# Patient Record
Sex: Female | Born: 1990 | Race: White | Hispanic: No | Marital: Married | Smoking: Never smoker
Health system: Southern US, Community
[De-identification: ages and names within clinical notes are randomized; demographics above are authoritative.]

## PROBLEM LIST (undated history)

## (undated) ENCOUNTER — Inpatient Hospital Stay (HOSPITAL_COMMUNITY): Payer: Self-pay

## (undated) ENCOUNTER — Inpatient Hospital Stay: Admission: EM | Payer: Self-pay | Source: Home / Self Care

## (undated) DIAGNOSIS — Z1501 Genetic susceptibility to malignant neoplasm of breast: Secondary | ICD-10-CM

## (undated) DIAGNOSIS — Z15060 Genetic susceptibility to colorectal cancer: Secondary | ICD-10-CM

---

## 2017-12-25 ENCOUNTER — Other Ambulatory Visit: Payer: Self-pay | Admitting: Obstetrics & Gynecology

## 2017-12-25 DIAGNOSIS — R9230 Dense breasts, unspecified: Secondary | ICD-10-CM

## 2017-12-27 ENCOUNTER — Other Ambulatory Visit: Payer: Self-pay | Admitting: Obstetrics & Gynecology

## 2017-12-27 DIAGNOSIS — R9230 Dense breasts, unspecified: Secondary | ICD-10-CM

## 2018-02-20 ENCOUNTER — Other Ambulatory Visit: Payer: Self-pay

## 2019-05-20 ENCOUNTER — Other Ambulatory Visit: Payer: Self-pay | Admitting: Obstetrics & Gynecology

## 2019-05-20 DIAGNOSIS — Z1509 Genetic susceptibility to other malignant neoplasm: Secondary | ICD-10-CM

## 2019-05-21 ENCOUNTER — Other Ambulatory Visit: Payer: Self-pay | Admitting: Obstetrics & Gynecology

## 2019-05-21 DIAGNOSIS — Z1231 Encounter for screening mammogram for malignant neoplasm of breast: Secondary | ICD-10-CM

## 2019-07-01 ENCOUNTER — Other Ambulatory Visit: Payer: Self-pay

## 2019-07-01 ENCOUNTER — Ambulatory Visit: Payer: 59 | Attending: Internal Medicine

## 2019-07-01 DIAGNOSIS — Z20822 Contact with and (suspected) exposure to covid-19: Secondary | ICD-10-CM

## 2019-07-03 LAB — NOVEL CORONAVIRUS, NAA: SARS-CoV-2, NAA: NOT DETECTED

## 2019-07-14 ENCOUNTER — Ambulatory Visit
Admission: RE | Admit: 2019-07-14 | Discharge: 2019-07-14 | Disposition: A | Discharge status: Home / Self Care | Payer: 59 | Source: Ambulatory Visit | Attending: Obstetrics & Gynecology | Admitting: Obstetrics & Gynecology

## 2019-07-14 ENCOUNTER — Other Ambulatory Visit: Payer: Self-pay

## 2019-07-14 DIAGNOSIS — Z1231 Encounter for screening mammogram for malignant neoplasm of breast: Secondary | ICD-10-CM

## 2020-08-09 ENCOUNTER — Other Ambulatory Visit: Payer: Self-pay | Admitting: Obstetrics and Gynecology

## 2020-08-09 DIAGNOSIS — Z1231 Encounter for screening mammogram for malignant neoplasm of breast: Secondary | ICD-10-CM

## 2020-08-12 LAB — OB RESULTS CONSOLE HIV ANTIBODY (ROUTINE TESTING): HIV: NONREACTIVE

## 2020-09-28 ENCOUNTER — Ambulatory Visit: Payer: 59

## 2020-11-17 ENCOUNTER — Other Ambulatory Visit: Payer: Self-pay

## 2020-11-17 ENCOUNTER — Ambulatory Visit
Admission: RE | Admit: 2020-11-17 | Discharge: 2020-11-17 | Disposition: A | Discharge status: Home / Self Care | Payer: 59 | Source: Ambulatory Visit | Attending: Obstetrics and Gynecology | Admitting: Obstetrics and Gynecology

## 2020-11-17 DIAGNOSIS — Z1231 Encounter for screening mammogram for malignant neoplasm of breast: Secondary | ICD-10-CM

## 2021-02-07 LAB — OB RESULTS CONSOLE HEPATITIS B SURFACE ANTIGEN: Hepatitis B Surface Ag: NEGATIVE

## 2021-02-07 LAB — HEPATITIS C ANTIBODY: HCV Ab: NEGATIVE

## 2021-02-07 LAB — OB RESULTS CONSOLE HIV ANTIBODY (ROUTINE TESTING): HIV: NONREACTIVE

## 2021-02-07 LAB — OB RESULTS CONSOLE RUBELLA ANTIBODY, IGM: Rubella: IMMUNE

## 2021-09-16 ENCOUNTER — Inpatient Hospital Stay (HOSPITAL_COMMUNITY)
Admission: AD | Admit: 2021-09-16 | Discharge: 2021-09-19 | DRG: 787 | Disposition: A | Discharge status: Home / Self Care | Payer: 59 | Attending: Obstetrics and Gynecology | Admitting: Obstetrics and Gynecology

## 2021-09-16 ENCOUNTER — Encounter (HOSPITAL_COMMUNITY): Payer: Self-pay | Admitting: Obstetrics and Gynecology

## 2021-09-16 ENCOUNTER — Other Ambulatory Visit: Payer: Self-pay

## 2021-09-16 DIAGNOSIS — Z3A37 37 weeks gestation of pregnancy: Secondary | ICD-10-CM

## 2021-09-16 DIAGNOSIS — B001 Herpesviral vesicular dermatitis: Secondary | ICD-10-CM | POA: Diagnosis present

## 2021-09-16 DIAGNOSIS — O479 False labor, unspecified: Secondary | ICD-10-CM

## 2021-09-16 DIAGNOSIS — Z6791 Unspecified blood type, Rh negative: Secondary | ICD-10-CM

## 2021-09-16 DIAGNOSIS — O321XX0 Maternal care for breech presentation, not applicable or unspecified: Principal | ICD-10-CM | POA: Diagnosis present

## 2021-09-16 DIAGNOSIS — O26893 Other specified pregnancy related conditions, third trimester: Secondary | ICD-10-CM | POA: Diagnosis present

## 2021-09-16 DIAGNOSIS — O9852 Other viral diseases complicating childbirth: Secondary | ICD-10-CM | POA: Diagnosis present

## 2021-09-16 NOTE — MAU Note (Signed)
Rachael Neal is a 31 y.o. at Unknown here in MAU reporting: Ctx 5-6 min apart for the last 3 hours. No LOF or bleeding.  ?LMP:  ?Onset of complaint: 09/16/21 ?Pain score:  ?There were no vitals filed for this visit.   ?FHT: ?Lab orders placed from triage:  ? ?

## 2021-09-17 ENCOUNTER — Inpatient Hospital Stay (HOSPITAL_COMMUNITY): Payer: 59 | Admitting: Certified Registered"

## 2021-09-17 ENCOUNTER — Inpatient Hospital Stay (HOSPITAL_BASED_OUTPATIENT_CLINIC_OR_DEPARTMENT_OTHER): Payer: 59

## 2021-09-17 ENCOUNTER — Encounter (HOSPITAL_COMMUNITY)
Admission: AD | Disposition: A | Discharge status: Home / Self Care | Payer: Self-pay | Source: Home / Self Care | Attending: Obstetrics and Gynecology

## 2021-09-17 DIAGNOSIS — Z3A37 37 weeks gestation of pregnancy: Secondary | ICD-10-CM

## 2021-09-17 DIAGNOSIS — O9852 Other viral diseases complicating childbirth: Secondary | ICD-10-CM | POA: Diagnosis present

## 2021-09-17 DIAGNOSIS — O321XX0 Maternal care for breech presentation, not applicable or unspecified: Secondary | ICD-10-CM

## 2021-09-17 DIAGNOSIS — O479 False labor, unspecified: Secondary | ICD-10-CM

## 2021-09-17 DIAGNOSIS — Z6791 Unspecified blood type, Rh negative: Secondary | ICD-10-CM | POA: Diagnosis not present

## 2021-09-17 DIAGNOSIS — O26893 Other specified pregnancy related conditions, third trimester: Secondary | ICD-10-CM | POA: Diagnosis present

## 2021-09-17 DIAGNOSIS — B001 Herpesviral vesicular dermatitis: Secondary | ICD-10-CM | POA: Diagnosis present

## 2021-09-17 LAB — TYPE AND SCREEN
ABO/RH(D): O NEG
Antibody Screen: NEGATIVE

## 2021-09-17 LAB — CBC
HCT: 35.7 % — ABNORMAL LOW (ref 36.0–46.0)
HCT: 34.2 % — ABNORMAL LOW (ref 36.0–46.0)
Hemoglobin: 12.7 g/dL (ref 12.0–15.0)
Hemoglobin: 12.3 g/dL (ref 12.0–15.0)
MCH: 32.3 pg (ref 26.0–34.0)
MCH: 32.1 pg (ref 26.0–34.0)
MCHC: 35.6 g/dL (ref 30.0–36.0)
MCHC: 36 g/dL (ref 30.0–36.0)
MCV: 90.8 fL (ref 80.0–100.0)
MCV: 89.3 fL (ref 80.0–100.0)
Platelets: 251 10*3/uL (ref 150–400)
Platelets: 228 10*3/uL (ref 150–400)
RBC: 3.93 MIL/uL (ref 3.87–5.11)
RBC: 3.83 MIL/uL — ABNORMAL LOW (ref 3.87–5.11)
RDW: 13.2 % (ref 11.5–15.5)
RDW: 13 % (ref 11.5–15.5)
WBC: 13.7 10*3/uL — ABNORMAL HIGH (ref 4.0–10.5)
WBC: 20 10*3/uL — ABNORMAL HIGH (ref 4.0–10.5)
nRBC: 0 % (ref 0.0–0.2)
nRBC: 0 % (ref 0.0–0.2)

## 2021-09-17 LAB — OB RESULTS CONSOLE GBS: GBS: NEGATIVE

## 2021-09-17 LAB — RPR: RPR Ser Ql: NONREACTIVE

## 2021-09-17 SURGERY — Surgical Case
Anesthesia: Spinal

## 2021-09-17 MED ORDER — RHO D IMMUNE GLOBULIN 1500 UNIT/2ML IJ SOSY
300.0000 ug | PREFILLED_SYRINGE | Freq: Once | INTRAMUSCULAR | Status: AC
  Administered 2021-09-17: 300 ug via INTRAVENOUS
  Filled 2021-09-17: qty 2

## 2021-09-17 MED ORDER — DEXAMETHASONE SODIUM PHOSPHATE 10 MG/ML IJ SOLN
INTRAMUSCULAR | Status: DC | PRN
  Administered 2021-09-17 (×2): 4 mg via INTRAVENOUS

## 2021-09-17 MED ORDER — OXYTOCIN-SODIUM CHLORIDE 30-0.9 UT/500ML-% IV SOLN
INTRAVENOUS | Status: AC
  Filled 2021-09-17: qty 500

## 2021-09-17 MED ORDER — OXYTOCIN-SODIUM CHLORIDE 30-0.9 UT/500ML-% IV SOLN
2.5000 [IU]/h | INTRAVENOUS | Status: AC
  Administered 2021-09-17: 2.5 [IU]/h via INTRAVENOUS
  Filled 2021-09-17: qty 500

## 2021-09-17 MED ORDER — ONDANSETRON HCL 4 MG/2ML IJ SOLN
4.0000 mg | Freq: Three times a day (TID) | INTRAMUSCULAR | Status: DC | PRN
  Administered 2021-09-17: 4 mg via INTRAVENOUS

## 2021-09-17 MED ORDER — EPHEDRINE 5 MG/ML INJ
INTRAVENOUS | Status: AC
  Filled 2021-09-17: qty 5

## 2021-09-17 MED ORDER — KETOROLAC TROMETHAMINE 30 MG/ML IJ SOLN
INTRAMUSCULAR | Status: AC
  Filled 2021-09-17: qty 1

## 2021-09-17 MED ORDER — CEFAZOLIN SODIUM-DEXTROSE 2-4 GM/100ML-% IV SOLN
2.0000 g | INTRAVENOUS | Status: AC
  Administered 2021-09-17: 2 g via INTRAVENOUS
  Filled 2021-09-17: qty 100

## 2021-09-17 MED ORDER — ONDANSETRON HCL 4 MG/2ML IJ SOLN
INTRAMUSCULAR | Status: AC
  Filled 2021-09-17: qty 2

## 2021-09-17 MED ORDER — PHENYLEPHRINE HCL-NACL 20-0.9 MG/250ML-% IV SOLN
INTRAVENOUS | Status: AC
  Filled 2021-09-17: qty 250

## 2021-09-17 MED ORDER — MORPHINE SULFATE (PF) 0.5 MG/ML IJ SOLN
INTRAMUSCULAR | Status: AC
  Filled 2021-09-17: qty 10

## 2021-09-17 MED ORDER — SOD CITRATE-CITRIC ACID 500-334 MG/5ML PO SOLN
30.0000 mL | ORAL | Status: AC
  Administered 2021-09-17: 30 mL via ORAL
  Filled 2021-09-17: qty 30

## 2021-09-17 MED ORDER — KETOROLAC TROMETHAMINE 30 MG/ML IJ SOLN
30.0000 mg | Freq: Four times a day (QID) | INTRAMUSCULAR | Status: DC | PRN
  Administered 2021-09-17: 30 mg via INTRAMUSCULAR

## 2021-09-17 MED ORDER — LACTATED RINGERS IV SOLN: INTRAVENOUS | Status: DC | PRN

## 2021-09-17 MED ORDER — FENTANYL CITRATE (PF) 100 MCG/2ML IJ SOLN
INTRAMUSCULAR | Status: AC
  Filled 2021-09-17: qty 2

## 2021-09-17 MED ORDER — FENTANYL CITRATE (PF) 100 MCG/2ML IJ SOLN: 50.0000 ug | INTRAMUSCULAR | Status: DC | PRN

## 2021-09-17 MED ORDER — HYDROMORPHONE HCL 1 MG/ML IJ SOLN: 0.2500 mg | INTRAMUSCULAR | Status: DC | PRN

## 2021-09-17 MED ORDER — OXYTOCIN-SODIUM CHLORIDE 30-0.9 UT/500ML-% IV SOLN
INTRAVENOUS | Status: DC | PRN
  Administered 2021-09-17: 250 mL via INTRAVENOUS

## 2021-09-17 MED ORDER — MEPERIDINE HCL 25 MG/ML IJ SOLN: 6.2500 mg | INTRAMUSCULAR | Status: DC | PRN

## 2021-09-17 MED ORDER — FENTANYL CITRATE (PF) 100 MCG/2ML IJ SOLN
50.0000 ug | Freq: Once | INTRAMUSCULAR | Status: AC
  Administered 2021-09-17: 50 ug via INTRAVENOUS
  Filled 2021-09-17: qty 2

## 2021-09-17 MED ORDER — ZOLPIDEM TARTRATE 5 MG PO TABS: 5.0000 mg | ORAL_TABLET | Freq: Every evening | ORAL | Status: DC | PRN

## 2021-09-17 MED ORDER — SCOPOLAMINE 1 MG/3DAYS TD PT72
1.0000 | MEDICATED_PATCH | Freq: Once | TRANSDERMAL | Status: DC
  Administered 2021-09-17: 1.5 mg via TRANSDERMAL
  Filled 2021-09-17: qty 1

## 2021-09-17 MED ORDER — ONDANSETRON HCL 4 MG/2ML IJ SOLN
INTRAMUSCULAR | Status: DC | PRN
  Administered 2021-09-17: 4 mg via INTRAVENOUS

## 2021-09-17 MED ORDER — WITCH HAZEL-GLYCERIN EX PADS: 1.0000 "application " | MEDICATED_PAD | CUTANEOUS | Status: DC | PRN

## 2021-09-17 MED ORDER — PHENYLEPHRINE 40 MCG/ML (10ML) SYRINGE FOR IV PUSH (FOR BLOOD PRESSURE SUPPORT)
PREFILLED_SYRINGE | INTRAVENOUS | Status: DC | PRN
  Administered 2021-09-17: 80 ug via INTRAVENOUS
  Administered 2021-09-17 (×2): 40 ug via INTRAVENOUS

## 2021-09-17 MED ORDER — LACTATED RINGERS IV SOLN: INTRAVENOUS | Status: DC

## 2021-09-17 MED ORDER — COCONUT OIL OIL: 1.0000 "application " | TOPICAL_OIL | Status: DC | PRN

## 2021-09-17 MED ORDER — DIBUCAINE (PERIANAL) 1 % EX OINT: 1.0000 "application " | TOPICAL_OINTMENT | CUTANEOUS | Status: DC | PRN

## 2021-09-17 MED ORDER — ACETAMINOPHEN 10 MG/ML IV SOLN: 1000.0000 mg | Freq: Once | INTRAVENOUS | Status: DC | PRN

## 2021-09-17 MED ORDER — NALOXONE HCL 0.4 MG/ML IJ SOLN: 0.4000 mg | INTRAMUSCULAR | Status: DC | PRN

## 2021-09-17 MED ORDER — IBUPROFEN 600 MG PO TABS
600.0000 mg | ORAL_TABLET | Freq: Four times a day (QID) | ORAL | Status: DC
  Administered 2021-09-18 – 2021-09-19 (×5): 600 mg via ORAL
  Filled 2021-09-17 (×5): qty 1

## 2021-09-17 MED ORDER — MORPHINE SULFATE (PF) 0.5 MG/ML IJ SOLN
INTRAMUSCULAR | Status: DC | PRN
Start: 2021-09-17 — End: 2021-09-17
  Administered 2021-09-17: 150 ug via INTRATHECAL

## 2021-09-17 MED ORDER — DIPHENHYDRAMINE HCL 25 MG PO CAPS: 25.0000 mg | ORAL_CAPSULE | Freq: Four times a day (QID) | ORAL | Status: DC | PRN

## 2021-09-17 MED ORDER — PROCHLORPERAZINE EDISYLATE 10 MG/2ML IJ SOLN
10.0000 mg | Freq: Once | INTRAMUSCULAR | Status: AC | PRN
  Administered 2021-09-17: 10 mg via INTRAVENOUS
  Filled 2021-09-17: qty 2

## 2021-09-17 MED ORDER — LACTATED RINGERS IV BOLUS
1000.0000 mL | Freq: Once | INTRAVENOUS | Status: AC
  Administered 2021-09-17: 1000 mL via INTRAVENOUS

## 2021-09-17 MED ORDER — BUPIVACAINE IN DEXTROSE 0.75-8.25 % IT SOLN
INTRATHECAL | Status: DC | PRN
  Administered 2021-09-17: 1.6 mL via INTRATHECAL

## 2021-09-17 MED ORDER — OXYCODONE HCL 5 MG PO TABS: 5.0000 mg | ORAL_TABLET | Freq: Once | ORAL | Status: DC | PRN

## 2021-09-17 MED ORDER — STERILE WATER FOR IRRIGATION IR SOLN
Status: DC | PRN
  Administered 2021-09-17: 1

## 2021-09-17 MED ORDER — EPHEDRINE SULFATE-NACL 50-0.9 MG/10ML-% IV SOSY
PREFILLED_SYRINGE | INTRAVENOUS | Status: DC | PRN
  Administered 2021-09-17: 5 mg via INTRAVENOUS

## 2021-09-17 MED ORDER — PHENYLEPHRINE 40 MCG/ML (10ML) SYRINGE FOR IV PUSH (FOR BLOOD PRESSURE SUPPORT)
PREFILLED_SYRINGE | INTRAVENOUS | Status: AC
  Filled 2021-09-17: qty 10

## 2021-09-17 MED ORDER — KETOROLAC TROMETHAMINE 30 MG/ML IJ SOLN
30.0000 mg | Freq: Four times a day (QID) | INTRAMUSCULAR | Status: AC
Start: 2021-09-17 — End: 2021-09-18
  Administered 2021-09-17 – 2021-09-18 (×4): 30 mg via INTRAVENOUS
  Filled 2021-09-17 (×4): qty 1

## 2021-09-17 MED ORDER — PRENATAL MULTIVITAMIN CH
1.0000 | ORAL_TABLET | Freq: Every day | ORAL | Status: DC
  Administered 2021-09-18 – 2021-09-19 (×2): 1 via ORAL
  Filled 2021-09-17 (×3): qty 1

## 2021-09-17 MED ORDER — NALOXONE HCL 4 MG/10ML IJ SOLN
1.0000 ug/kg/h | INTRAVENOUS | Status: DC | PRN
  Filled 2021-09-17: qty 5

## 2021-09-17 MED ORDER — PHENYLEPHRINE HCL-NACL 20-0.9 MG/250ML-% IV SOLN
INTRAVENOUS | Status: DC | PRN
  Administered 2021-09-17: 40 ug/min via INTRAVENOUS

## 2021-09-17 MED ORDER — SENNOSIDES-DOCUSATE SODIUM 8.6-50 MG PO TABS
2.0000 | ORAL_TABLET | Freq: Every day | ORAL | Status: DC
  Administered 2021-09-18 – 2021-09-19 (×2): 2 via ORAL
  Filled 2021-09-17 (×2): qty 2

## 2021-09-17 MED ORDER — KETOROLAC TROMETHAMINE 30 MG/ML IJ SOLN: 30.0000 mg | Freq: Four times a day (QID) | INTRAMUSCULAR | Status: DC | PRN

## 2021-09-17 MED ORDER — ACETAMINOPHEN 500 MG PO TABS
1000.0000 mg | ORAL_TABLET | Freq: Four times a day (QID) | ORAL | Status: DC
  Administered 2021-09-17 – 2021-09-19 (×8): 1000 mg via ORAL
  Filled 2021-09-17 (×9): qty 2

## 2021-09-17 MED ORDER — FENTANYL CITRATE (PF) 100 MCG/2ML IJ SOLN
INTRAMUSCULAR | Status: DC | PRN
  Administered 2021-09-17: 15 ug via INTRATHECAL

## 2021-09-17 MED ORDER — SIMETHICONE 80 MG PO CHEW: 80.0000 mg | CHEWABLE_TABLET | ORAL | Status: DC | PRN

## 2021-09-17 MED ORDER — DIPHENHYDRAMINE HCL 50 MG/ML IJ SOLN: 12.5000 mg | INTRAMUSCULAR | Status: DC | PRN

## 2021-09-17 MED ORDER — MENTHOL 3 MG MT LOZG: 1.0000 | LOZENGE | OROMUCOSAL | Status: DC | PRN

## 2021-09-17 MED ORDER — DIPHENHYDRAMINE HCL 25 MG PO CAPS: 25.0000 mg | ORAL_CAPSULE | ORAL | Status: DC | PRN

## 2021-09-17 MED ORDER — SIMETHICONE 80 MG PO CHEW
80.0000 mg | CHEWABLE_TABLET | Freq: Three times a day (TID) | ORAL | Status: DC
  Administered 2021-09-17 – 2021-09-19 (×4): 80 mg via ORAL
  Filled 2021-09-17 (×5): qty 1

## 2021-09-17 MED ORDER — OXYCODONE HCL 5 MG PO TABS
5.0000 mg | ORAL_TABLET | ORAL | Status: DC | PRN
  Administered 2021-09-18: 5 mg via ORAL
  Filled 2021-09-17: qty 1

## 2021-09-17 MED ORDER — OXYCODONE HCL 5 MG/5ML PO SOLN: 5.0000 mg | Freq: Once | ORAL | Status: DC | PRN

## 2021-09-17 MED ORDER — SODIUM CHLORIDE 0.9% FLUSH: 3.0000 mL | INTRAVENOUS | Status: DC | PRN

## 2021-09-17 MED ORDER — SODIUM CHLORIDE 0.9 % IR SOLN
Status: DC | PRN
  Administered 2021-09-17: 1

## 2021-09-17 MED ORDER — TETANUS-DIPHTH-ACELL PERTUSSIS 5-2.5-18.5 LF-MCG/0.5 IM SUSY: 0.5000 mL | PREFILLED_SYRINGE | Freq: Once | INTRAMUSCULAR | Status: DC

## 2021-09-17 SURGICAL SUPPLY — 36 items
APL SKNCLS STERI-STRIP NONHPOA (GAUZE/BANDAGES/DRESSINGS) ×1
BENZOIN TINCTURE PRP APPL 2/3 (GAUZE/BANDAGES/DRESSINGS) ×2 IMPLANT
CHLORAPREP W/TINT 26ML (MISCELLANEOUS) ×2 IMPLANT
CLAMP CORD UMBIL (MISCELLANEOUS) IMPLANT
CLOTH BEACON ORANGE TIMEOUT ST (SAFETY) ×2 IMPLANT
DRSG OPSITE POSTOP 4X10 (GAUZE/BANDAGES/DRESSINGS) ×2 IMPLANT
ELECT REM PT RETURN 9FT ADLT (ELECTROSURGICAL) ×2
ELECTRODE REM PT RTRN 9FT ADLT (ELECTROSURGICAL) ×1 IMPLANT
EXTRACTOR VACUUM M CUP 4 TUBE (SUCTIONS) IMPLANT
GLOVE BIO SURGEON STRL SZ7.5 (GLOVE) ×2 IMPLANT
GLOVE BIOGEL PI IND STRL 7.0 (GLOVE) ×1 IMPLANT
GLOVE BIOGEL PI IND STRL 7.5 (GLOVE) ×1 IMPLANT
GLOVE BIOGEL PI INDICATOR 7.0 (GLOVE) ×1
GLOVE BIOGEL PI INDICATOR 7.5 (GLOVE) ×1
GOWN STRL REUS W/TWL LRG LVL3 (GOWN DISPOSABLE) ×4 IMPLANT
KIT ABG SYR 3ML LUER SLIP (SYRINGE) IMPLANT
NDL HYPO 25X5/8 SAFETYGLIDE (NEEDLE) IMPLANT
NEEDLE HYPO 25X5/8 SAFETYGLIDE (NEEDLE) IMPLANT
NS IRRIG 1000ML POUR BTL (IV SOLUTION) ×2 IMPLANT
PACK C SECTION WH (CUSTOM PROCEDURE TRAY) ×2 IMPLANT
PAD OB MATERNITY 4.3X12.25 (PERSONAL CARE ITEMS) ×2 IMPLANT
PENCIL SMOKE EVAC W/HOLSTER (ELECTROSURGICAL) ×2 IMPLANT
RTRCTR C-SECT PINK 25CM LRG (MISCELLANEOUS) ×2 IMPLANT
STRIP CLOSURE SKIN 1/2X4 (GAUZE/BANDAGES/DRESSINGS) ×2 IMPLANT
STRIP CLOSURE SKIN 1/4X4 (GAUZE/BANDAGES/DRESSINGS) ×1 IMPLANT
SUT CHROMIC 2 0 CT 1 (SUTURE) ×2 IMPLANT
SUT MNCRL AB 3-0 PS2 27 (SUTURE) ×2 IMPLANT
SUT PLAIN 2 0 XLH (SUTURE) ×2 IMPLANT
SUT VIC AB 0 CT1 36 (SUTURE) ×2 IMPLANT
SUT VIC AB 0 CTX 36 (SUTURE) ×6
SUT VIC AB 0 CTX36XBRD ANBCTRL (SUTURE) ×3 IMPLANT
SUT VIC AB 2-0 SH 27 (SUTURE)
SUT VIC AB 2-0 SH 27XBRD (SUTURE) IMPLANT
TOWEL OR 17X24 6PK STRL BLUE (TOWEL DISPOSABLE) ×2 IMPLANT
TRAY FOLEY W/BAG SLVR 14FR LF (SET/KITS/TRAYS/PACK) ×2 IMPLANT
WATER STERILE IRR 1000ML POUR (IV SOLUTION) ×2 IMPLANT

## 2021-09-17 NOTE — Transfer of Care (Signed)
Immediate Anesthesia Transfer of Care Note ? ?Patient: Rachael Neal ? ?Procedure(s) Performed: CESAREAN SECTION ? ?Patient Location: PACU ? ?Anesthesia Type:Spinal ? ?Level of Consciousness: awake, alert  and oriented ? ?Airway & Oxygen Therapy: Patient Spontanous Breathing ? ?Post-op Assessment: Report given to RN and Post -op Vital signs reviewed and stable ? ?Post vital signs: Reviewed and stable ? ?Last Vitals:  ?Vitals Value Taken Time  ?BP 119/52 09/17/21 0418  ?Temp    ?Pulse 82 09/17/21 0420  ?Resp 22 09/17/21 0420  ?SpO2 98 % 09/17/21 0420  ?Vitals shown include unvalidated device data. ? ?Last Pain:  ?Vitals:  ? 09/17/21 0049  ?TempSrc:   ?PainSc: 5   ?   ? ?  ? ?Complications: No notable events documented. ?

## 2021-09-17 NOTE — Op Note (Addendum)
Cesarean Section Procedure Note ? ?Indications: P0 at 61 6/7wks presented to MAU in labor and breech. ? ?Pre-operative Diagnosis: 1.37 6/7wks 2.Breech Presentation 3.Labor ?  ?Post-operative Diagnosis: 1.37 6/7wks 2.Breech Presentation 3.Labor ? ?Procedure: Primary Low Transverse Cesarean Section ? ?Surgeon: Osborn Coho, MD   ? ?Assistants: Dale [place], CNM ? ?Anesthesia: Regional ? ?Anesthesiologist: Lucretia Kern, MD  ? ?Procedure Details  ?The patient was taken to the operating room secondary to Breech Presentation in labor after the risks, benefits, complications, treatment options, and expected outcomes were discussed with the patient.  The patient concurred with the proposed plan, giving informed consent which was signed and witnessed. The patient was taken to Operating Room B, identified as Rachael Neal and the procedure verified as C-Section Delivery. A Time Out was held and the above information confirmed. ? ?After induction of anesthesia by obtaining a spinal, the patient was prepped and draped in the usual sterile manner. A Pfannenstiel skin incision was made and carried down through the subcutaneous tissue to the underlying layer of fascia.  The fascia was incised bilaterally and extended transversely bilaterally with the Mayo scissors. Kocher clamps were placed on the inferior aspect of the fascial incision and the underlying rectus muscle was separated from the fascia. The same was done on the superior aspect of the fascial incision.  The peritoneum was identified, entered bluntly and extended manually.  An Alexis self-retaining retractor was placed.  The utero-vesical peritoneal reflection was incised transversely and the bladder flap was bluntly freed from the lower uterine segment. A low transverse uterine incision was made with the scalpel and extended bilaterally with the bandage scissors.  The infant was delivered via breech extraction without difficulty.  After the umbilical cord was  clamped and cut, the infant was handed to the awaiting pediatricians.  Cord blood was obtained for evaluation.  The placenta was removed intact and appeared to be within normal limits. The uterus was cleared of all clots and debris. The uterine incision was closed with running interlocking sutures of 0 Vicryl and a second imbricating layer was performed as well.   Bilateral tubes and ovaries appeared to be within normal limits.  Good hemostasis was noted.  Copious irrigation was performed until clear.  The peritoneum was repaired with 2-0 chromic via a running suture.  The fascia was reapproximated with a running suture of 0 Vicryl. The subcutaneous tissue was reapproximated with 3 interrupted sutures of 2-0 plain.  The skin was reapproximated with a subcuticular suture of 3-0 monocryl.  Steristrips were applied. ? ?Instrument, sponge, and needle counts were correct prior to abdominal closure and at the conclusion of the case.  The patient was awaiting transfer to the recovery room in good condition. ? ?Findings: ?Live female infant with Apgars 8 at one minute and 9 at five minutes.  Normal appearing bilateral ovaries and fallopian tubes were noted. ? ?Estimated Blood Loss:  345 ml ?        ?Drains: foley to gravity 75 cc clear urine ?        ?Total IV Fluids: 1500 ml ?        ?Specimens to Pathology: none ?        ?Complications:  None; patient tolerated the procedure well. ?        ?Disposition: PACU - hemodynamically stable. ?        ?Condition: stable ? ?Attending Attestation: I performed the procedure. ? ?I was present and scrubbed and the assistant was required due  to complexity of anatomy. ?  ?

## 2021-09-17 NOTE — Anesthesia Postprocedure Evaluation (Signed)
Anesthesia Post Note ? ?Patient: Ketara Maestre ? ?Procedure(s) Performed: CESAREAN SECTION ? ?  ? ?Patient location during evaluation: PACU ?Anesthesia Type: Spinal ?Level of consciousness: oriented and awake and alert ?Pain management: pain level controlled ?Vital Signs Assessment: post-procedure vital signs reviewed and stable ?Respiratory status: spontaneous breathing, respiratory function stable and nonlabored ventilation ?Cardiovascular status: blood pressure returned to baseline and stable ?Postop Assessment: no headache, no backache, no apparent nausea or vomiting and spinal receding ?Anesthetic complications: no ? ? ?No notable events documented. ? ?Last Vitals:  ?Vitals:  ? 09/17/21 0515 09/17/21 0530  ?BP: 109/71 111/73  ?Pulse: 81 96  ?Resp: 20 18  ?Temp:    ?SpO2: 100% 100%  ?  ?Last Pain:  ?Vitals:  ? 09/17/21 0445  ?TempSrc: Axillary  ?PainSc:   ? ?Pain Goal:   ?Lidia Collum ? ? ? ? ?

## 2021-09-17 NOTE — MAU Provider Note (Signed)
None  ?  ? ? ?S: Rachael Neal is a 31 y.o. G1P0 at [redacted]w[redacted]d  who presents to MAU today complaining contractions q 5 minutes minutes since 8 pm. She denies vaginal bleeding. She denies LOF. She reports normal fetal movement.   ? ?O: BP 121/76   Pulse 84   Temp 98.5 ?F (36.9 ?C) (Oral)   Resp 18   Ht 5\' 3"  (1.6 m)   Wt 91 kg   LMP 11/10/2020   SpO2 100%   BMI 35.53 kg/m?  ?GENERAL: Well-developed, well-nourished female in no acute distress.  ?HEAD: Normocephalic, atraumatic.  ?CHEST: Normal effort of breathing, regular heart rate ?ABDOMEN: Soft, nontender, gravid ? ?Cervical exam:  ?Dilation: 2.5 ?Effacement (%): 80 ?Cervical Position: Posterior ?Station: -3 ?Presentation: Undeterminable ?Exam by:: [email or web address], NP ? ? ?Fetal Monitoring: ?Baseline: 135 ?Variability: moderate ?Accelerations: 15x15 ?Decelerations: none ?Contractions: Q3-5 minutes ? ?MDM ?-Reviewed scanned prenatal records from [facility]. GBS negative.  ?-Patient given IV fluids & fentanyl for painful contractions while monitoring for labor ? ?A: ?SIUP at [redacted]w[redacted]d  ?Active labor ?1. Breech presentation, single or unspecified fetus   ?2. Uterine contractions   ?3. [redacted] weeks gestation of pregnancy   ? ?-Dr. [redacted]w[redacted]d notified of cervical exam & fetal presentation ?-Prepare for OR ? ?Su Hilt, NP ?09/17/2021 2:07 AM ? ?

## 2021-09-17 NOTE — MAU Note (Signed)
Dr. Mancel Bale at bedside; informed consent signed.  ?

## 2021-09-17 NOTE — Anesthesia Preprocedure Evaluation (Signed)
Anesthesia Evaluation  ?Patient identified by MRN, date of birth, ID band ?Patient awake ? ? ? ?Reviewed: ?Allergy & Precautions, H&P , NPO status , Patient's Chart, lab work & pertinent test results ? ?History of Anesthesia Complications ?Negative for: history of anesthetic complications ? ?Airway ?Mallampati: II ? ?TM Distance: >3 FB ? ? ? ? Dental ?  ?Pulmonary ?neg pulmonary ROS,  ?  ?Pulmonary exam normal ? ? ? ? ? ? ? Cardiovascular ?negative cardio ROS ? ? ?Rhythm:regular Rate:Normal ? ? ?  ?Neuro/Psych ?negative neurological ROS ? negative psych ROS  ? GI/Hepatic ?negative GI ROS, Neg liver ROS,   ?Endo/Other  ?negative endocrine ROS ? Renal/GU ?negative Renal ROS  ?negative genitourinary ?  ?Musculoskeletal ? ? Abdominal ?  ?Peds ? Hematology ?negative hematology ROS ?(+)   ?     Component                Value               Date/Time            ?     HGB                      12.7                09/17/2021 0203      ?     HCT                      35.7 (L)            09/17/2021 0203      ?     PLT                      251                 09/17/2021 0203        ?Anesthesia Other Findings ?Breech in labor ? Reproductive/Obstetrics ?(+) Pregnancy ?G1P0 at [redacted]w[redacted]d ? ?  ? ? ? ? ? ? ? ? ? ? ? ? ? ?  ?  ? ? ? ? ? ? ? ? ?Anesthesia Physical ?Anesthesia Plan ? ?ASA: 2 ? ?Anesthesia Plan: Spinal  ? ?Post-op Pain Management:   ? ?Induction:  ? ?PONV Risk Score and Plan: Ondansetron and Treatment may vary due to age or medical condition ? ?Airway Management Planned:  ? ?Additional Equipment:  ? ?Intra-op Plan:  ? ?Post-operative Plan:  ? ?Informed Consent: I have reviewed the patients History and Physical, chart, labs and discussed the procedure including the risks, benefits and alternatives for the proposed anesthesia with the patient or authorized representative who has indicated his/her understanding and acceptance.  ? ? ? ? ? ?Plan Discussed with: Anesthesiologist ? ?Anesthesia Plan  Comments:   ? ? ? ? ? ? ?Anesthesia Quick Evaluation ? ?

## 2021-09-17 NOTE — MAU Note (Signed)
Ultrasound at bedside

## 2021-09-17 NOTE — Anesthesia Procedure Notes (Signed)
Spinal ° °Patient location during procedure: OR °Reason for block: surgical anesthesia °Staffing °Performed: anesthesiologist  °Anesthesiologist: Brecken Walth E, MD °Preanesthetic Checklist °Completed: patient identified, IV checked, risks and benefits discussed, surgical consent, monitors and equipment checked, pre-op evaluation and timeout performed °Spinal Block °Patient position: sitting °Prep: DuraPrep and site prepped and draped °Patient monitoring: continuous pulse ox, blood pressure and heart rate °Approach: midline °Location: L3-4 °Injection technique: single-shot °Needle °Needle type: Pencan  °Needle gauge: 24 G °Needle length: 10 cm °Assessment °Events: CSF return °Additional Notes °Functioning IV was confirmed and monitors were applied. Sterile prep and drape, including hand hygiene and sterile gloves were used. The patient was positioned and the spine was prepped. The skin was anesthetized with lidocaine.  Free flow of clear CSF was obtained prior to injecting local anesthetic into the CSF. The needle was carefully withdrawn. The patient tolerated the procedure well.  ° ° ° °

## 2021-09-17 NOTE — Progress Notes (Signed)
POD #0 ?Brief exam ? ?S: ?Feeling good. Reports adequate pain relief with IV and PO meds. Lactation present and assisting with breastfeeding.  ? ?O: ?Vitals with BMI 09/17/2021 09/17/2021 09/17/2021  ?Height - - -  ?Weight - - -  ?BMI - - -  ?Systolic [id number]  ?Diastolic 79 77 76  ?Pulse 82 85 91  ? ?A/P: ?POD 1 ?Primary C/S for breech presentation ?Normal exam ?Continue routine post-op orders.  ? ?Burman Foster, MSN, CNM ?09/17/2021 11:04 AM ? ?

## 2021-09-17 NOTE — H&P (Addendum)
Rachael Neal is a 31 y.o. female, G1P0000, IUP at 37.6 weeks, [facility] pt of DR  Connye Burkitt presenting for Breech in latent labor at 2.5cm with bloody show, being admitted for [facility] with Dr Su Hilt. Parnoroma low risk female. Anatomy scan done at 20.1 weeks anterior placenta with echogenic focus on left ventricle.  UTD on TDAP. Pt endorse + Fm. Denies vaginal leakage.  ? ?Prenatal History: ?BRAC2 gene mutation  ?RH- ?Vit D def ?Recurrent cold sore.  ? ?Blood Type  O-  ?Rhogam  Given 07/20/2021  ?Antibody  Neg  ?GTT: Early: ??? ?Third Trimester: 2  ?Rubella:  immune  ?RPR:   nr  ?HBsAg:  Neg, HCV neg  ?HIV:   neg  ?GBS:  Negative (For PCN allergy, check sensitivities)   ?Chlamydia: neg  ?GC: neg  ?PAP: NILM  ?Hgb Electrophoresis:  ???  ?Hgb NOB: ???    28W: 12.3  ? ? ?Patient Active Problem List  ? Diagnosis Date Noted  ? Breech presentation of fetus 09/17/2021  ?  ? ?Active Ambulatory Problems  ?  Diagnosis Date Noted  ? No Active Ambulatory Problems  ? ?Resolved Ambulatory Problems  ?  Diagnosis Date Noted  ? No Resolved Ambulatory Problems  ? ?No Additional Past Medical History  ?  ? ? ?No medications prior to admission.  ? ? ?History reviewed. No pertinent past medical history.  ? ?No current facility-administered medications on file prior to encounter.  ? ?No current outpatient medications on file prior to encounter.  ?  ? ?Not on File ? ?OB History   ? ? Gravida  ?1  ? Para  ?   ? Term  ?   ? Preterm  ?   ? AB  ?   ? Living  ?   ?  ? ? SAB  ?   ? IAB  ?   ? Ectopic  ?   ? Multiple  ?   ? Live Births  ?   ?   ?  ?  ? ?History reviewed. No pertinent past medical history. ? ?Family History: family history is not on file. ?Social History:  has no history on file for tobacco use, alcohol use, and drug use. ? ? ?Prenatal Transfer Tool  ?Maternal Diabetes: No ?Genetic Screening: Normal ?Maternal Ultrasounds/Referrals: Other:echogenic focus on left ventricle.  ?Fetal Ultrasounds or other Referrals:  None ?Maternal Substance  Abuse:  No ?Significant Maternal Medications:  None ?Significant Maternal Lab Results: Other: GBS negative, reoccurring cold sore.  ? ?ROS:  ?Review of Systems  ?Constitutional: Negative.   ?HENT: Negative.    ?Eyes: Negative.   ?Respiratory: Negative.    ?Cardiovascular: Negative.   ?Gastrointestinal:  Positive for abdominal pain.  ?Genitourinary: Negative.   ?Musculoskeletal: Negative.   ?Skin: Negative.   ?Neurological: Negative.   ?Endo/Heme/Allergies: Negative.   ?Psychiatric/Behavioral: Negative.     ? ?Physical Exam: ?BP 121/76   Pulse 84   Temp 98.5 ?F (36.9 ?C) (Oral)   Resp 18   Ht 5\' 3"  (1.6 m)   Wt 91 kg   LMP 11/10/2020   SpO2 100%   BMI 35.53 kg/m?   ?Physical Exam ?Vitals and nursing note reviewed. Exam conducted with a chaperone present.  ?Constitutional:   ?   Appearance: Normal appearance.  ?HENT:  ?   Head: Normocephalic and atraumatic.  ?   Nose: Nose normal.  ?   Mouth/Throat:  ?   Mouth: Mucous membranes are moist.  ?Eyes:  ?  Conjunctiva/sclera: Conjunctivae normal.  ?Cardiovascular:  ?   Rate and Rhythm: Normal rate and regular rhythm.  ?   Pulses: Normal pulses.  ?   Heart sounds: Normal heart sounds.  ?Pulmonary:  ?   Effort: Pulmonary effort is normal.  ?   Breath sounds: Normal breath sounds.  ?Abdominal:  ?   General: Bowel sounds are normal.  ?Musculoskeletal:     ?   General: Normal range of motion.  ?   Cervical back: Normal range of motion and neck supple.  ?Skin: ?   General: Skin is warm.  ?   Capillary Refill: Capillary refill takes less than 2 seconds.  ?Neurological:  ?   General: No focal deficit present.  ?   Mental Status: She is alert.  ?Psychiatric:     ?   Mood and Affect: Mood normal.  ?  ? ?NST: FHR baseline 130 bpm, Variability: moderate, Accelerations:present, Decelerations:  Absent= Cat 1/Reactive ?UC:   irregular, every 5-6 minutes ?SVE:   Dilation: 2.5 ?Effacement (%): 80 ?Station: -3 ?Exam by:: Jorje Guild, NP ?Leopold's: Position breech, via [place] in  MAU ? ?Labs: ?No results found for this or any previous visit (from the past 24 hour(s)). ? ?Imaging:  ?No results found. ? ?MAU Course: ?Orders Placed This Encounter  ?Procedures  ? [place] MFM OB LIMITED  ? CBC  ? RPR  ? Diet NPO time specified  ? Informed Consent Details: Physician/Practitioner Attestation; Transcribe to consent form and obtain patient signature  ? Initiate Pre-op Protocol  ? Clip operative site  ? Informed Consent Details: Physician/Practitioner Attestation; Transcribe to consent form and obtain patient signature  ? Verify informed consent  ? Place and maintain sequential compression device  ? Instruct patient regarding incentive spirometry  ? Instruct patient in turn, cough, deep breathe, and leg exercises  ? Type and screen  ? Insert peripheral IV  ? Admit to Inpatient (patient's expected length of stay will be greater than 2 midnights or inpatient only procedure)  ? ?Meds ordered this encounter  ?Medications  ? lactated ringers bolus 1,000 mL  ? fentaNYL (SUBLIMAZE) injection 50 mcg  ? sodium citrate-citric acid (ORACIT) solution 30 mL  ? ceFAZolin (ANCEF) IVPB 2g/100 mL premix  ?  Order Specific Question:   Indication:  ?  Answer:   Surgical Prophylaxis  ? ? ?Assessment/Plan: ?Rachael Neal is a 31 y.o. female, G1P0000, IUP at 37.6 weeks, [facility] pt of DR  Delora Fuel presenting for Breech in latent labor at 2.5cm with bloody show, being admitted for [facility] with Dr Mancel Bale. Parnoroma low risk female. Anatomy scan done at 20.1 weeks anterior placenta with echogenic focus on left ventricle.  UTD on TDAP. Pt endorse + Fm. Denies vaginal leakage.  ? ?Prenatal History: ?BRAC2 gene mutation  ?RH- ?Vit D def ?Recurrent cold sore.  ? ?FWB: Cat 1 Fetal Tracing.  ? ?Plan: ?Admit to OR per consult with Dr Mancel Bale ?Routine [facility] orders ?Spinal ?SCD ?Clip site ?NPO ?2gm ancef ?Anticipate PCS for breech and labor ? ?Noralyn Pick NP-C, CNM, MSN ?09/17/2021, 2:04 AM ? ?GBS negative per nurse found documented in [facility] records.   Risks benefits and alternatives discussed with the patient including but not limited to bleeding infection and injury.  Questions answered and consent signed and witnessed. ? ?  ?

## 2021-09-18 ENCOUNTER — Encounter (HOSPITAL_COMMUNITY): Payer: Self-pay | Admitting: Obstetrics and Gynecology

## 2021-09-18 LAB — RH IG WORKUP (INCLUDES ABO/RH)
Fetal Screen: NEGATIVE
Gestational Age(Wks): 37.6
Unit division: 0

## 2021-09-18 NOTE — Progress Notes (Signed)
Postpartum Note Day #1 ? ?S:  Patient doing well.  Pain controlled.  Tolerating regular diet.   Ambulating and voiding without difficulty. Working on breastfeeding. Denies fevers, chills, chest pain, SOB, N/V, or worsening bilateral LE edema. ? ?Lochia: Minimal ?Infant feeding:  Breast ?Circumcision:  Desires prior to discharge ?Contraception:  Undecided ? ?O: Temp:  [97.9 ?F (36.6 ?C)-99.3 ?F (37.4 ?C)] 97.9 ?F (36.6 ?C) (03/20 0645) ?Pulse Rate:  [65-85] 72 (03/20 0645) ?Resp:  [15-18] 16 (03/20 0645) ?BP: (102-122)/(61-79) 110/72 (03/20 0645) ?SpO2:  [96 %-100 %] 100 % (03/20 0645) ?Gen: NAD, pleasant and cooperative ?CV: RRR ?Resp: CTAB, no wheezes/rales/rhonchi ?Abdomen: soft, non-distended, non-tender throughout ?Uterus: firm, non-tender, below umbilicus ?Incision: c/d/i, bandage in place  ?Ext: 2+ bilateral LE edema, no bilateral calf tenderness ? ?Labs:  ?Recent Labs  ?  09/17/21 ?0203 09/17/21 ?0828  ?HGB 12.7 12.3  ? ? ?A/P: Patient is a 31 y.o. G1P0 POD#1 s/p LTCS (breech). ? ?- Pain well controlled  ?- GU: UOP is adequate ?- GI: Tolerating regular diet ?- Activity: encouraged sitting up to chair and ambulation as tolerated ?- DVT Prophylaxis: Frequent ambulation ?- Labs: stable as above ?- Rh negative: Rhogam given on 3/19 ? ?Circumcision Consent: ?Routine circumcisions performed on newborns have been identified as voluntary, elective procedures by [employer] such as the [employer] [facility].  It is considered an elective procedure with no definitive medical indication and carries risks. ? ?Risks include but are not limited to bleeding, infection, damage to penis with possible need for further surgery, poor cosmesis, and local anesthetic risks. ? ?Circumcision will only be performed if patient is deemed to have normal anatomy by his Pediatrician, meets adequate criteria for a newborn of similar gestational age after birth and is without infection or other medical issue  contraindicating an elective procedure. ? ? ?Patient understands and agrees with above consent ?Patient discussed with parents of infant ? ? ?Disposition:  D/C home POD#2-3  ? ?Steva Ready, DO ?[phone] (office) ? ? ? ? ? ? ?

## 2021-09-18 NOTE — Lactation Note (Addendum)
This note was copied from a baby's chart. ?Lactation Consultation Note ?Mom has small very short shaft nipples. Needed finger stimulation to soften areola and nipple shaft. Then able to t-cup and get baby to latch. Once baby was latched well and suckling well., you can let go of t-cup hold and baby kept nipple mouth BF well. ?Mom was excited that is the beat the baby has done per mom. ?Strongly encouraged mom tow ear shells in am. ?Mom has hand pump. Encouraged to pre-pump to evert nipple more. ? ?Mom needs #21 flanges but not available at this time. ?Mom able to get colostrum. Praised mom. ?Encouraged to call for assistance as needed. ? ?Mom is to wear shells in am. ?Pre-pump and use finger stimulation before latching. ?Feed in football position for now until feedings are well established. ?Mom may end up needing NS if unable to obtain latches by her self. ? ? ?Patient Name: Rachael Neal ?Today's Date: 09/18/2021 ?Reason for consult: Initial assessment;Early term 37-38.6wks;Primapara ?Age:32 hours ? ?Maternal Data ?Has patient been taught Hand Expression?: Yes ?Does the patient have breastfeeding experience prior to this delivery?: No ? ?Feeding ?  ? ?LATCH Score ?Latch: Grasps breast easily, tongue down, lips flanged, rhythmical sucking. ? ?Audible Swallowing: A few with stimulation ? ?Type of Nipple: Everted at rest and after stimulation (very short shaft) ? ?Comfort (Breast/Nipple): Filling, red/small blisters or bruises, mild/mod discomfort (heavy breast) ? ?Hold (Positioning): Assistance needed to correctly position infant at breast and maintain latch. (needed full assistance latching) ? ?LATCH Score: 7 ? ? ?Lactation Tools Discussed/Used ?Tools: Shells;Pump ?Breast pump type: Manual ?Pump Education: Setup, frequency, and cleaning ?Reason for Pumping: very short shaft ?Pumping frequency: pre-pump ? ?Interventions ?Interventions: Breast feeding basics reviewed;Assisted with latch;Skin to skin;Breast  massage;Hand express;Pre-pump if needed;Breast compression;Adjust position;Support pillows;Position options;Shells;Hand pump;LC Services brochure ? ?Discharge ?  ? ?Consult Status ?Consult Status: Follow-up ?Date: 09/18/21 ?Follow-up type: In-patient ? ? ? ?Theodoro Kalata ?09/18/2021, 12:45 AM ? ? ? ?

## 2021-09-18 NOTE — Lactation Note (Signed)
This note was copied from a baby's chart. ?Lactation Consultation Note ? ?Patient Name: Rachael Neal ?Today's Date: 09/18/2021 ?Reason for consult: Follow-up assessment;1st time breastfeeding;Primapara;Early term 37-38.6wks ?Age:32 hours ? ? ?P1 mother whose infant is now 69 hours old.  This is an ETI at 37+6 weeks.  Mother's current feeding preference is breast. ? ?Mother requested latch assistance. ? ?Baby "Rachael Neal" was asleep next to mother when I arrived.  Assisted to awaken him prior to latching.  Reviewed hand expression with mother; no drops noted at this time.  Assisted to latch in the football hold to the left breast.  Observed him feeding for 23 minutes before he self released.  Intermittent swallows noted. Placed him STS on mother's chest and he began rooting.  Assisted to latch in the cross cradle to the alternate breast where he continued to feed.  Left the room after 5 additional minutes and he was still feeding; starting to become sleepy. ? ?Encouraged to feed on cue or at least 8-12 times/24 hours.  Suggested mother call her RN/LC for latch assistance as needed.  Father and one other family member present.  RN updated. ? ? ?Maternal Data ?Has patient been taught Hand Expression?: Yes ?Does the patient have breastfeeding experience prior to this delivery?: No ? ?Feeding ?Mother's Current Feeding Choice: Breast Milk ? ?LATCH Score ?Latch: Grasps breast easily, tongue down, lips flanged, rhythmical sucking. ? ?Audible Swallowing: A few with stimulation ? ?Type of Nipple:  (Still feeding when I left the room) ? ?Comfort (Breast/Nipple): Soft / non-tender ? ?Hold (Positioning): Assistance needed to correctly position infant at breast and maintain latch. ? ?LATCH Score: 8 ? ? ?Lactation Tools Discussed/Used ?  ? ?Interventions ?Interventions: Breast feeding basics reviewed;Assisted with latch;Skin to skin;Breast massage;Hand express;Breast compression;Position options;Support pillows;Adjust  position;Education ? ?Discharge ?Pump: Personal ?WIC Program: No ? ?Consult Status ?Consult Status: Follow-up ?Date: 09/19/21 ?Follow-up type: In-patient ? ? ? ?Cienna Dumais R Mersedes Alber ?09/18/2021, 2:09 PM ? ? ? ?

## 2021-09-19 ENCOUNTER — Encounter (HOSPITAL_COMMUNITY): Payer: Self-pay

## 2021-09-19 MED ORDER — OXYCODONE HCL 5 MG PO TABS: 5.0000 mg | ORAL_TABLET | Freq: Four times a day (QID) | ORAL | 0 refills | Status: DC | PRN

## 2021-09-19 MED ORDER — IBUPROFEN 600 MG PO TABS: 600.0000 mg | ORAL_TABLET | Freq: Four times a day (QID) | ORAL | 0 refills | Status: DC

## 2021-09-19 NOTE — Lactation Note (Signed)
This note was copied from a baby's chart. ?Lactation Consultation Note ? ?Patient Name: Boy Jhana Leffall ?Today's Date: 09/19/2021 ?Reason for consult: Follow-up assessment;Early term 37-38.6wks;1st time breastfeeding;Primapara ?Age:32 hours ? ? ?P1 mother whose infant is now 58 hours old.  This is an ETI at 37+6 weeks.  Mother's current feeding preference is breast. ? ?Baby "Haze Boyden" was swaddled and asleep in the bassinet when I arrived.  He received a circumcision this morning.  Reviewed current feeding plan and mother excited to report much progress since our interaction yesterday.  She feels like "Haze Boyden" is feeding well at every feeding now.  Discussed possible sleepiness today due to the circumcision and informed her that he may want to feed more tonight.   ? ?Mother has our OP phone number for any further questions/concerns after discharge.  Father present and supportive.   ? ? ?Maternal Data ?  ? ?Feeding ?Mother's Current Feeding Choice: Breast Milk ? ?LATCH Score ?  ? ?  ? ?  ? ?  ? ?  ? ?  ? ? ?Lactation Tools Discussed/Used ?  ? ?Interventions ?  ? ?Discharge ?Discharge Education: Engorgement and breast care ? ?Consult Status ?Consult Status: Complete ?Date: 09/19/21 ?Follow-up type: Call as needed ? ? ? ?Rosaura Bolon R Zarinah Oviatt ?09/19/2021, 11:18 AM ? ? ? ?

## 2021-09-19 NOTE — Discharge Summary (Signed)
Postpartum Discharge Summary ? ?Date of Service: 09/19/21  ?   ?Patient Name: Rachael Neal ?DOB: 1990-07-07 ?MRN: [id number] ? ?Date of admission: 09/16/2021 ?Delivery date:09/17/2021  ?Delivering provider: Everett Graff  ?Date of discharge: 09/19/2021 ? ?Admitting diagnosis: Breech presentation of fetus [O32.1XX0] ?Intrauterine pregnancy: [redacted]w[redacted]d    ?Secondary diagnosis:  Principal Problem: ?  Postpartum care following cesarean delivery ?Active Problems: ?  Breech presentation of fetus ? ?Additional problems: Rh negative    ?Discharge diagnosis: Term Pregnancy Delivered                                              ?Post partum procedures: Rhogam ?Augmentation: N/A ?Complications: None ? ?Hospital course: Onset of Labor With Unplanned C/S   ?31y.o. yo G1P0 at 367w1das admitted in Latent Labor on 09/16/2021. The patient went for cesarean section due to  Breech presentation . Delivery details as follows: ?Membrane Rupture Time/Date: 3:23 AM ,09/17/2021   ?Delivery Method:C-Section, Low Transverse  ?Details of operation can be found in separate operative note. Patient had an uncomplicated postpartum course.  She is ambulating,tolerating a regular diet, passing flatus, and urinating well.  Patient is discharged home in stable condition 09/19/21. ? ?Newborn Data: ?Birth date:09/17/2021  ?Birth time:3:23 AM  ?Gender:Female  ?Living status:Living  ?Apgars:8 ,9  ?Weight:3230 g  ? ?Magnesium Sulfate received: No ?BMZ received: No ?Rhophylac:Yes ?MMR:N/A ?T-DaP:Given prenatally ?Transfusion:No ? ?Physical exam  ?Vitals:  ? 09/18/21 0645 09/18/21 1450 09/18/21 1941 09/19/21 0530  ?BP: 110/72 110/67 107/73 105/79  ?Pulse: 72 100 78 79  ?Resp: _0 ?Temp: 97.9 ?F (36.6 ?C) 98.5 ?F (36.9 ?C) 98.7 ?F (37.1 ?C) 98.4 ?F (36.9 ?C)  ?TempSrc: Oral Oral Oral Oral  ?SpO2: 100% 100%    ?Weight:      ?Height:      ? ?General: alert, cooperative, and no distress ?Lochia: appropriate ?Uterine Fundus: firm ?Incision: Dressing is clean,  dry, and intact ?DVT Evaluation: No evidence of DVT seen on physical exam. ? ?Labs: ?Lab Results  ?Component Value Date  ? WBC 20.0 (H) 09/17/2021  ? HGB 12.3 09/17/2021  ? HCT 34.2 (L) 09/17/2021  ? MCV 89.3 09/17/2021  ? PLT 228 09/17/2021  ? ?No flowsheet data found. ?Edinburgh Score: ?Edinburgh Postnatal Depression Scale Screening Tool 09/18/2021  ?I have been able to laugh and see the funny side of things. 0  ?I have looked forward with enjoyment to things. 0  ?I have blamed myself unnecessarily when things went wrong. 1  ?I have been anxious or worried for no good reason. 2  ?I have felt scared or panicky for no good reason. 2  ?Things have been getting on top of me. 1  ?I have been so unhappy that I have had difficulty sleeping. 0  ?I have felt sad or miserable. 0  ?I have been so unhappy that I have been crying. 0  ?The thought of harming myself has occurred to me. 0  ?Edinburgh Postnatal Depression Scale Total 6  ? ? ? ? ?After visit meds:  ?Allergies as of 09/19/2021   ?No Known Allergies ?  ? ?  ?Medication List  ?  ? ?TAKE these medications   ? ?ibuprofen 600 MG tablet ?Commonly known as: ADVIL ?Take 1 tablet (600 mg total) by mouth every 6 (six) hours. ?  ?oxyCODONE 5 MG immediate  release tablet ?Commonly known as: Oxy IR/ROXICODONE ?Take 1-2 tablets (5-10 mg total) by mouth every 6 (six) hours as needed for moderate pain, severe pain or breakthrough pain. ?  ? ?  ? ? ? ?Discharge home in stable condition ?Infant Feeding: Breast ?Infant Disposition:home with mother ?Discharge instruction: per After Visit Summary and Postpartum booklet. ?Activity: Advance as tolerated. Pelvic rest for 6 weeks.  ?Diet: routine diet ?Anticipated Birth Control: Unsure ?Postpartum Appointment:6 weeks ?Additional Postpartum F/U: Incision check 2 weeks ?Future Appointments:No future appointments. ?Follow up Visit: ? Follow-up Information   ? ? Drema Dallas, DO Follow up in 2 week(s).   ?Specialty: Obstetrics and  Gynecology ?Why: Our office will arrange your 2 week incision check and your 6 week postpartum visit. ?Contact information: ?[address] ?[address] ?[place] [address] ?[phone] ? ? ?  ?  ? ?  ?  ? ?  ? ? ? ?  ? ?09/19/2021 ?Drema Dallas, DO ? ? ?

## 2021-09-19 NOTE — Progress Notes (Signed)
Postpartum Note Day #2 ? ?S:  Patient doing well.  Pain controlled.  Tolerating regular diet.   Ambulating and voiding without difficulty. Breastfeeding over the last 24 hours has been going very well. In room while Pediatric NP in the room - infant may be able to be discharged today pending status after circumcision. Denies fevers, chills, chest pain, SOB, N/V, or worsening bilateral LE edema. ? ?Lochia: Minimal ?Infant feeding:  Breast ?Circumcision:  Completed today ?Contraception:  Undecided ? ?O: Temp:  [98.4 ?F (36.9 ?C)-98.7 ?F (37.1 ?C)] 98.4 ?F (36.9 ?C) (03/21 0530) ?Pulse Rate:  [78-100] 79 (03/21 0530) ?Resp:  [16-18] 18 (03/21 0530) ?BP: (105-110)/(67-79) 105/79 (03/21 0530) ?SpO2:  [100 %] 100 % (03/20 1450) ?Gen: NAD, pleasant and cooperative ?CV: RRR ?Resp: CTAB, no wheezes/rales/rhonchi ?Abdomen: soft, non-distended, non-tender throughout ?Uterus: firm, non-tender, below umbilicus ?Incision: c/d/i, bandage in place  ?Ext: 2+ bilateral LE edema, no bilateral calf tenderness ? ?Labs:  ?Recent Labs  ?  09/17/21 ?0203 09/17/21 ?0828  ?HGB 12.7 12.3  ? ? ?A/P: Patient is a 31 y.o. G1P0 POD#2 s/p LTCS (breech). ? ?- Pain well controlled  ?- GU: UOP is adequate ?- GI: Tolerating regular diet ?- Activity: encouraged sitting up to chair and ambulation as tolerated ?- DVT Prophylaxis: Frequent ambulation ?- Labs: stable as above ?- Rh negative: Rhogam given on 3/19 ? ? ?Disposition:  D/C home today if infant discharged. Otherwise, discharge home tomorrow. ? ?Steva Ready, DO ?[phone] (office) ? ? ? ? ? ? ?

## 2021-09-27 ENCOUNTER — Telehealth (HOSPITAL_COMMUNITY): Payer: Self-pay | Admitting: *Deleted

## 2021-09-27 NOTE — Telephone Encounter (Signed)
Attempted Hospital Discharge Follow-Up Call.  Left voice mail requesting that patient return RN's phone call.  

## 2021-10-03 ENCOUNTER — Inpatient Hospital Stay (HOSPITAL_COMMUNITY): Admit: 2021-10-03 | Payer: 59 | Admitting: Obstetrics and Gynecology

## 2022-01-22 IMAGING — MG MM DIGITAL SCREENING BILAT W/ TOMO AND CAD
6 of 10 series · 6 of 30 positions shown · non-contrast
Comparison: Previous exam(s).

CLINICAL DATA: Screening.

EXAM:
DIGITAL SCREENING BILATERAL MAMMOGRAM WITH TOMOSYNTHESIS AND CAD
TECHNIQUE: Bilateral screening digital craniocaudal and mediolateral oblique
mammograms were obtained. Bilateral screening digital breast
tomosynthesis was performed. The images were evaluated with
computer-aided detection.

[L MLO synth-2D]
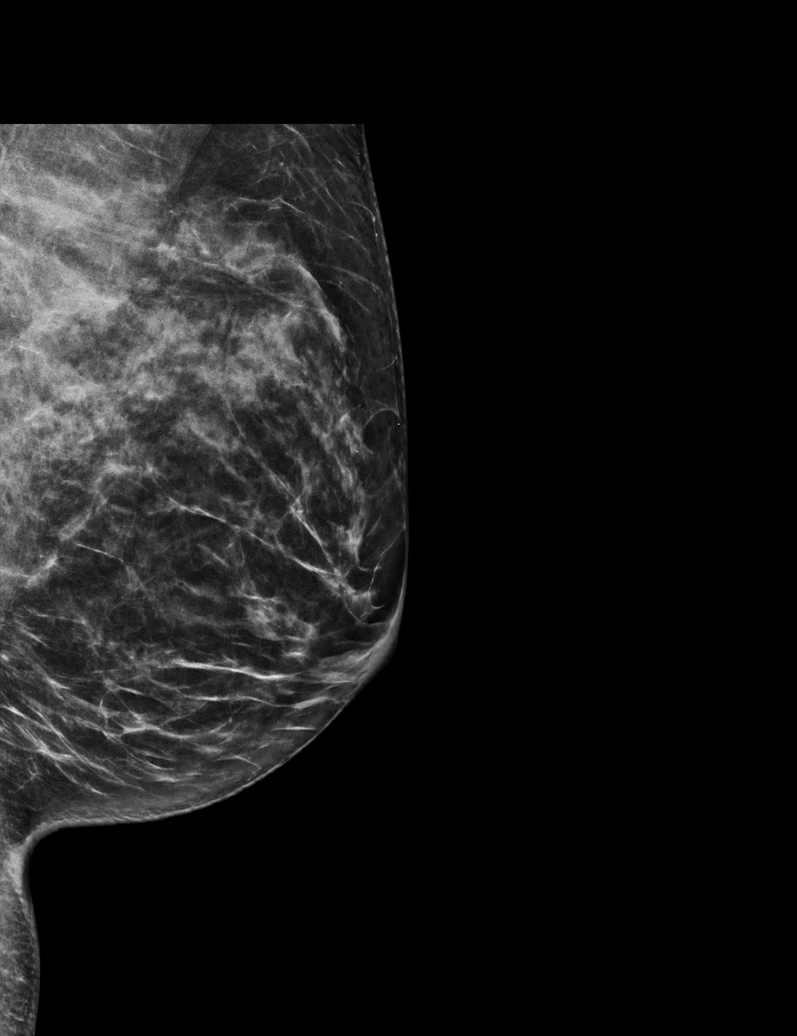

[R MLO synth-2D]
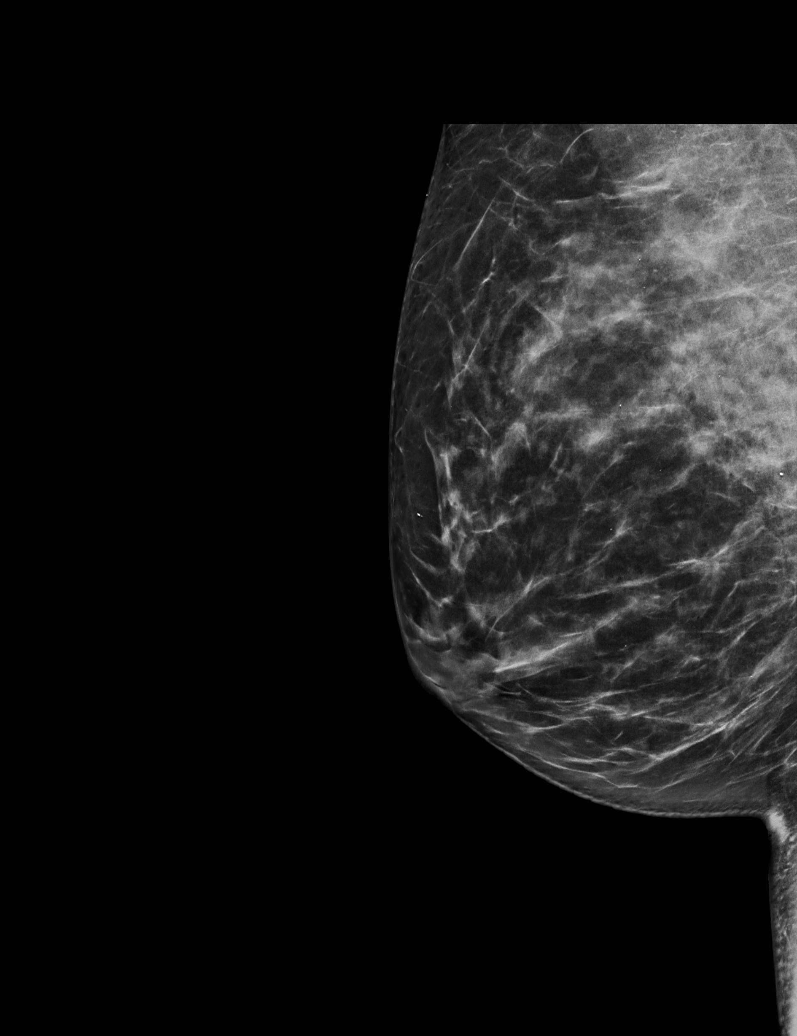

[R CV synth-2D]
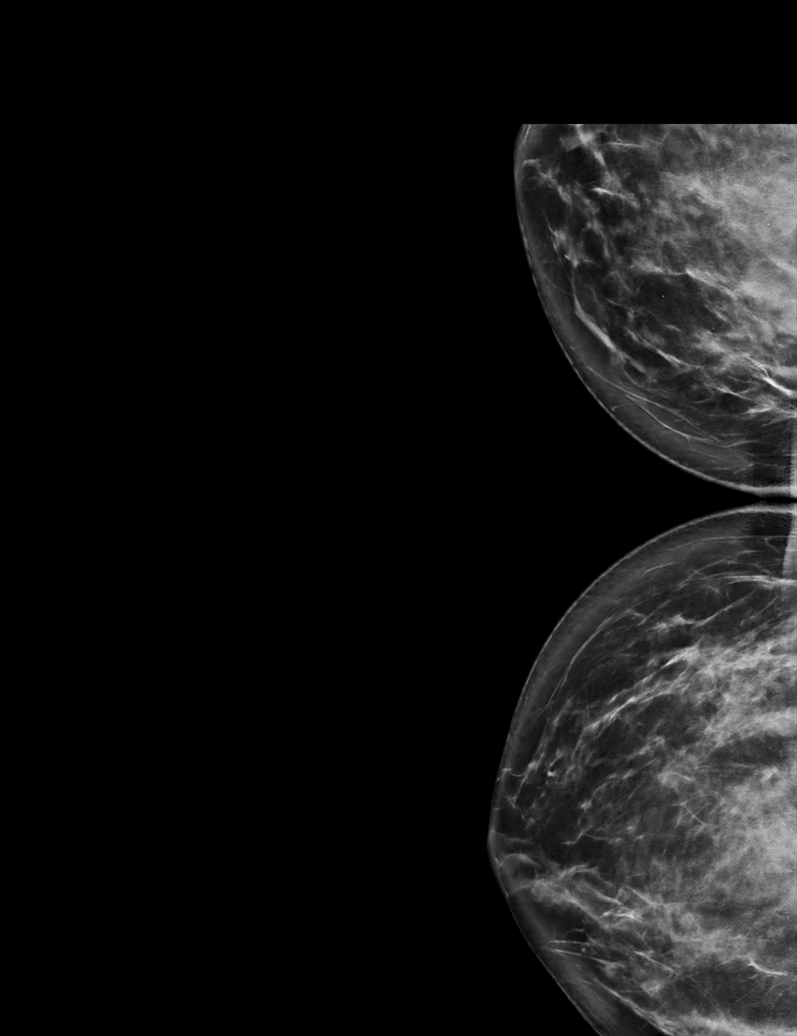

[R CC synth-2D]
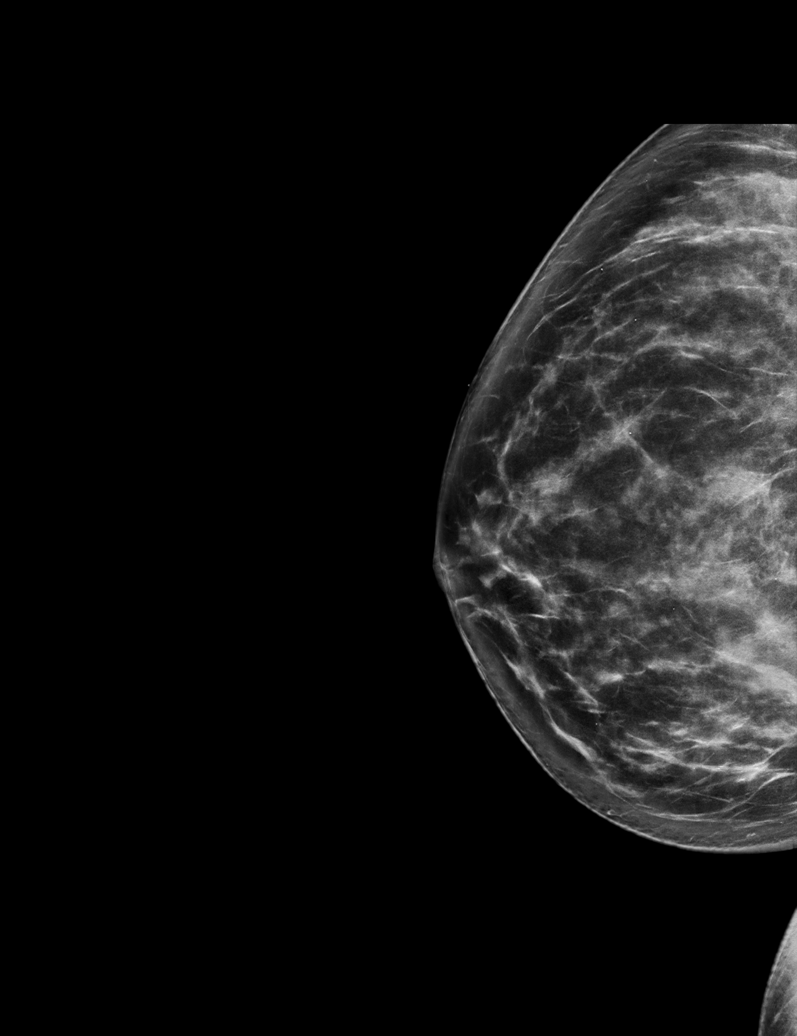

[L CC synth-2D]
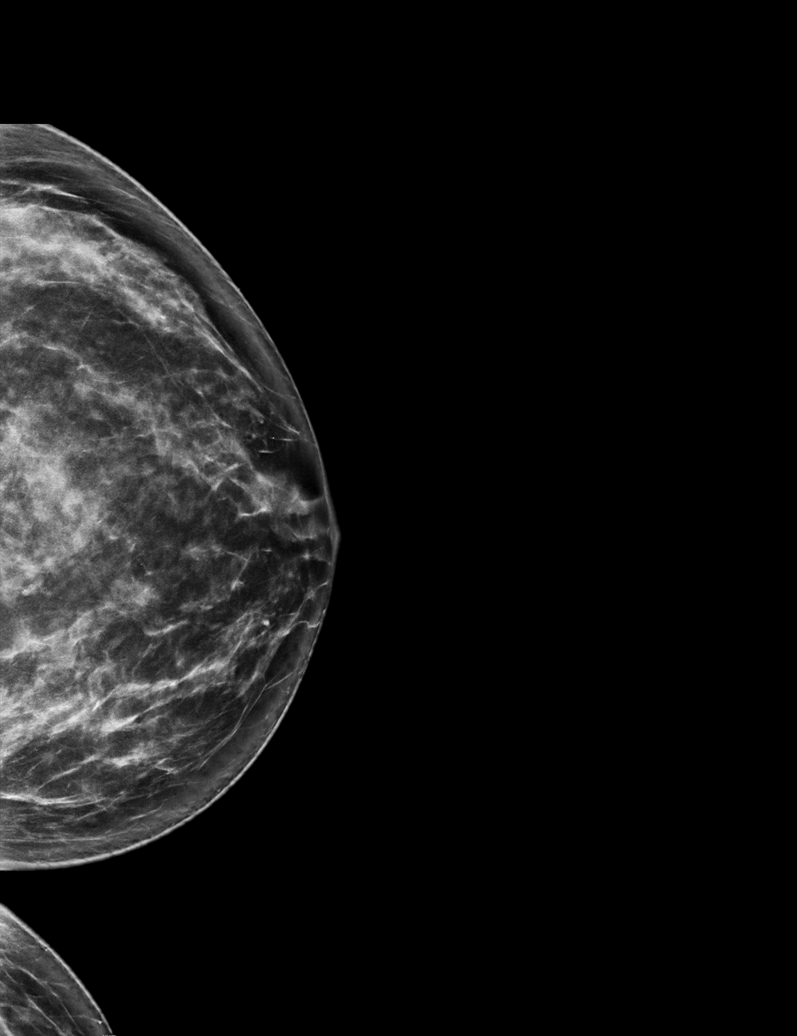

[L MLO tomo · tomo slice 32/63.0]
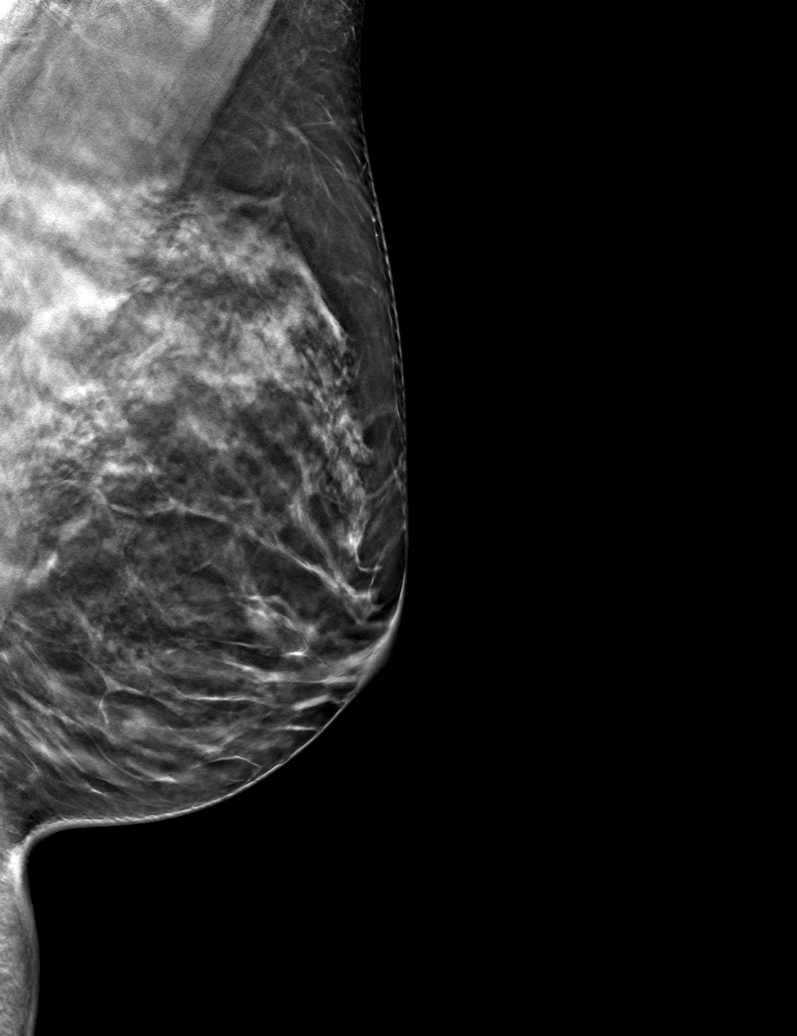

[6 of 30 positions shown; findings below may reference images not displayed]

ACR Breast Density Category c: The breast tissue is heterogeneously
dense, which may obscure small masses.
FINDINGS: There are no findings suspicious for malignancy. The images were
evaluated with computer-aided detection.
IMPRESSION: No mammographic evidence of malignancy. A result letter of this
screening mammogram will be mailed directly to the patient.

RECOMMENDATION:
Screening mammogram at age 39. (Code:Q0-J-G2E)

BI-RADS CATEGORY  1: Negative.

## 2022-02-21 ENCOUNTER — Encounter: Payer: Self-pay | Admitting: Obstetrics & Gynecology

## 2022-02-21 ENCOUNTER — Ambulatory Visit (INDEPENDENT_AMBULATORY_CARE_PROVIDER_SITE_OTHER): Payer: 59 | Admitting: Obstetrics & Gynecology

## 2022-02-21 VITALS — BP 113/77 | HR 69 | Ht 63.0 in | Wt 172.8 lb

## 2022-02-21 DIAGNOSIS — Z1501 Genetic susceptibility to malignant neoplasm of breast: Secondary | ICD-10-CM | POA: Diagnosis not present

## 2022-02-21 DIAGNOSIS — Z01419 Encounter for gynecological examination (general) (routine) without abnormal findings: Secondary | ICD-10-CM

## 2022-02-21 DIAGNOSIS — F419 Anxiety disorder, unspecified: Secondary | ICD-10-CM

## 2022-02-21 DIAGNOSIS — Z1502 Genetic susceptibility to malignant neoplasm of ovary: Secondary | ICD-10-CM | POA: Diagnosis not present

## 2022-02-21 DIAGNOSIS — Z1509 Genetic susceptibility to other malignant neoplasm: Secondary | ICD-10-CM

## 2022-02-21 NOTE — Progress Notes (Signed)
WELL-WOMAN EXAMINATION Patient name: Rachael Neal MRN [id number]  Date of birth: June 11, 1991 Chief Complaint:   Gynecologic Exam  History of Present Illness:   Rachael Neal is a 31 y.o. G3P1001 female being seen today for annual exam:  -Prior [facility] patient- $RemoveBefore'[]'[id number]$  records to be obtained Pt notes BRCA positive- pt is due for screening exams Ideally screening includes: BREAST SCREENING: every 6 mos alternating MRI and mammogram- due to insurance coverage plan for mammogram in 63mos OVARIAN SCREENING:  CA125/pelvic US can be used for short time frame until plan for RRSO   Desires another pregnancy.  Her biggest concern is adjusting to life s/p baby.  Notes worsening anxiety. Also notes skin changes and new allergies, which she didn't have prior to pregnancy.  She would prefer to avoid medications if possible.  Not seeing a therapist.  Patient's last menstrual period was 02/08/2022 (exact date). Denies issues with her menses The current method of family planning is abstinence.    Last pap 07/2020.  Last mammogram: 10/2020 Last colonoscopy: n/a      No data to display            Review of Systems:   Pertinent items are noted in HPI Denies any headaches, blurred vision, fatigue, shortness of breath, chest pain, abdominal pain, bowel movements, urination, or intercourse unless otherwise stated above.  Pertinent History Reviewed:  Reviewed past medical,surgical, social and family history.  Reviewed problem list, medications and allergies. Physical Assessment:   Vitals:   02/21/22 1021  BP: 113/77  Pulse: 69  Weight: 172 lb 12.8 oz (78.4 kg)  Height: $Remove'5\' 3"'OQZnCFQ$  (1.6 m)  Body mass index is 30.61 kg/m.        Physical Examination:   General appearance - well appearing, and in no distress  Mental status - alert, oriented to person, place, and time  Psych:  She has a normal mood and affect  Skin - warm and dry, normal color, no suspicious lesions noted  Chest - effort normal, all  lung fields clear to auscultation bilaterally  Heart - normal rate and regular rhythm  Neck:  midline trachea, no thyromegaly or nodules  Breasts - breasts appear normal, no suspicious masses, no skin or nipple changes or  axillary nodes  Abdomen - soft, nontender, nondistended, no masses or organomegaly  Pelvic - VULVA: normal appearing vulva with no masses, tenderness or lesions  VAGINA: normal appearing vagina with normal color and discharge, no lesions  CERVIX: normal appearing cervix without discharge or lesions, no CMT  UTERUS: uterus is felt to be normal size, shape, consistency and nontender   ADNEXA: No adnexal masses or tenderness noted.  Extremities:  No swelling or varicosities noted  Chaperone:  pt declined      Assessment & Plan:  1) Well-Woman Exam -reviewed ASCCP guidelines, pap up to date  2) Contraceptive management -doing ok with abstinence/tracking period  3) BRCA- $Remove'[]'OaMsAib$  records to confirm BRCA 1 or 2 - mammogram ordered -CA125/pelvic US ordered  4) Anxiety -for now strongly encouraged therapist -if desires medication or worsening of symptoms, RTC  Orders Placed This Encounter  Procedures   MM 3D SCREEN BREAST BILATERAL   US PELVIS TRANSVAGINAL NON-OB (TV ONLY)   CA 125    Meds: No orders of the defined types were placed in this encounter.   Follow-up: Return in about 1 year (around 02/22/2023) for Annual.   Janyth Pupa, DO Attending [place], [facility] for [facility], [facility]

## 2022-04-19 ENCOUNTER — Ambulatory Visit: Payer: 59

## 2022-06-07 ENCOUNTER — Ambulatory Visit
Admission: RE | Admit: 2022-06-07 | Discharge: 2022-06-07 | Disposition: A | Discharge status: Home / Self Care | Payer: 59 | Source: Ambulatory Visit | Attending: Obstetrics & Gynecology | Admitting: Obstetrics & Gynecology

## 2022-06-07 DIAGNOSIS — Z1502 Genetic susceptibility to malignant neoplasm of ovary: Secondary | ICD-10-CM

## 2022-06-15 ENCOUNTER — Other Ambulatory Visit: Payer: Self-pay | Admitting: *Deleted

## 2022-06-15 DIAGNOSIS — Z1501 Genetic susceptibility to malignant neoplasm of breast: Secondary | ICD-10-CM

## 2022-06-15 DIAGNOSIS — Z1502 Genetic susceptibility to malignant neoplasm of ovary: Secondary | ICD-10-CM

## 2022-07-03 ENCOUNTER — Ambulatory Visit (HOSPITAL_BASED_OUTPATIENT_CLINIC_OR_DEPARTMENT_OTHER): Payer: 59

## 2023-02-04 ENCOUNTER — Encounter: Payer: Self-pay | Admitting: Obstetrics & Gynecology

## 2023-02-04 ENCOUNTER — Other Ambulatory Visit (INDEPENDENT_AMBULATORY_CARE_PROVIDER_SITE_OTHER): Payer: 59

## 2023-02-04 VITALS — BP 123/84 | HR 92 | Wt 157.5 lb

## 2023-02-04 DIAGNOSIS — R300 Dysuria: Secondary | ICD-10-CM | POA: Diagnosis not present

## 2023-02-04 DIAGNOSIS — R3915 Urgency of urination: Secondary | ICD-10-CM

## 2023-02-04 DIAGNOSIS — R350 Frequency of micturition: Secondary | ICD-10-CM | POA: Diagnosis not present

## 2023-02-04 LAB — POCT URINALYSIS DIPSTICK OB
Glucose, UA: NEGATIVE
Ketones, UA: NEGATIVE
Nitrite, UA: POSITIVE
POC,PROTEIN,UA: NEGATIVE

## 2023-02-04 MED ORDER — SULFAMETHOXAZOLE-TRIMETHOPRIM 800-160 MG PO TABS: 1.0000 | ORAL_TABLET | Freq: Two times a day (BID) | ORAL | 0 refills | Status: DC

## 2023-02-04 MED ORDER — SULFAMETHOXAZOLE-TRIMETHOPRIM 800-160 MG PO TABS: 1.0000 | ORAL_TABLET | Freq: Two times a day (BID) | ORAL | 0 refills | Status: AC

## 2023-02-04 NOTE — Progress Notes (Addendum)
   NURSE VISIT- UTI SYMPTOMS   SUBJECTIVE:  Rachael Neal is a 32 y.o. G74P1001 female here for UTI symptoms. She is a GYN patient. She reports dysuria, urinary frequency, and urinary urgency.  OBJECTIVE:  BP 123/84 (BP Location: Left Arm, Patient Position: Sitting, Cuff Size: Normal)   Pulse 92   Wt 157 lb 8 oz (71.4 kg)   BMI 27.90 kg/m   Appears well, in no apparent distress  Results for orders placed or performed in visit on 02/04/23 (from the past 24 hour(s))  POC Urinalysis Dipstick OB   Collection Time: 02/04/23  2:16 PM  Result Value Ref Range   Color, UA     Clarity, UA     Glucose, UA Negative Negative   Bilirubin, UA     Ketones, UA negative    Spec Grav, UA     Blood, UA trace    pH, UA     POC,PROTEIN,UA Negative Negative, Trace, Small (1+), Moderate (2+), Large (3+), 4+   Urobilinogen, UA     Nitrite, UA Positive    Leukocytes, UA Large (3+) (A) Negative   Appearance     Odor      ASSESSMENT: GYN patient with UTI symptoms and positive nitrites  PLAN: Note routed to Dr. Charlotta Newton   Rx sent by provider today: Yes, patient requested. Urine culture sent Call or return to clinic prn if these symptoms worsen or fail to improve as anticipated. Follow-up: as needed   Caralyn Guile  02/04/2023 3:18 PM   UA consistent with UTI- prescription sent in  Chart reviewed for nurse visit. Agree with plan of care.  Myna Hidalgo, DO

## 2023-02-04 NOTE — Addendum Note (Signed)
Addended by: Sharon Seller on: 02/04/2023 05:02 PM   Modules accepted: Orders

## 2023-02-06 ENCOUNTER — Ambulatory Visit: Payer: 59 | Admitting: Obstetrics & Gynecology

## 2023-02-07 LAB — URINE CULTURE

## 2023-02-14 ENCOUNTER — Ambulatory Visit: Payer: 59 | Admitting: Adult Health

## 2023-03-14 ENCOUNTER — Encounter: Payer: Self-pay | Admitting: Obstetrics & Gynecology

## 2023-03-14 ENCOUNTER — Ambulatory Visit (INDEPENDENT_AMBULATORY_CARE_PROVIDER_SITE_OTHER): Payer: 59 | Admitting: Obstetrics & Gynecology

## 2023-03-14 VITALS — BP 104/69 | HR 62 | Ht 63.0 in | Wt 155.0 lb

## 2023-03-14 DIAGNOSIS — R102 Pelvic and perineal pain: Secondary | ICD-10-CM | POA: Diagnosis not present

## 2023-03-14 DIAGNOSIS — Z01419 Encounter for gynecological examination (general) (routine) without abnormal findings: Secondary | ICD-10-CM | POA: Diagnosis not present

## 2023-03-14 DIAGNOSIS — F419 Anxiety disorder, unspecified: Secondary | ICD-10-CM | POA: Diagnosis not present

## 2023-03-14 DIAGNOSIS — Z1502 Genetic susceptibility to malignant neoplasm of ovary: Secondary | ICD-10-CM

## 2023-03-14 DIAGNOSIS — Z1501 Genetic susceptibility to malignant neoplasm of breast: Secondary | ICD-10-CM

## 2023-03-14 DIAGNOSIS — Z1509 Genetic susceptibility to other malignant neoplasm: Secondary | ICD-10-CM

## 2023-03-14 NOTE — Progress Notes (Signed)
WELL-WOMAN EXAMINATION Patient name: Rachael Neal MRN [id number]  Date of birth: Nov 29, 1990 Chief Complaint:   Gynecologic Exam  History of Present Illness:   Rachael Neal is a 32 y.o. G1P1001  BRCA positive female being seen today for a routine well-woman exam.   Menses regular each month- currently tracking cycle and avoiding IC during ovulation/using pull out method.  Notes worsening ovulation pain- some months are better than others.  Does seem to improve with exercise.  Notes pain as well as light spotting with ovulation that lasted for a few days.  No urinary or GI concerns.  Anxiety: This has been an ongoing issue- no meds.On a good day rates her anxiety 4-5.  On a "bad" day 8/10.  Denies SI/HI.  She knows that this is something she needs to address but she just has not gotten around to it.  She notes just feeling overwhelmed with things.  She talked about wanting another pregnancy but also feeling stressed and overwhelmed about what that would mean  Denies issues with her menses The current method of family planning is coitus interruptus.    Last pap 07/2020.  Last mammogram: NA. Last colonoscopy: NA      No data to display            Review of Systems:   Pertinent items are noted in HPI Denies any headaches, blurred vision, fatigue, shortness of breath, chest pain, abdominal pain, bowel movements, urination, or intercourse unless otherwise stated above.  Pertinent History Reviewed:  Reviewed past medical,surgical, social and family history.  Reviewed problem list, medications and allergies. Physical Assessment:   Vitals:   03/14/23 1136  BP: 104/69  Pulse: 62  Weight: 155 lb (70.3 kg)  Height: 5\' 3"  (1.6 m)  Body mass index is 27.46 kg/m.        Physical Examination:   General appearance - well appearing, and in no distress  Mental status - alert, oriented to person, place, and time  Psych:  She has a normal mood and affect  Skin - warm and dry, normal  color, no suspicious lesions noted  Chest - effort normal, all lung fields clear to auscultation bilaterally  Heart - normal rate and regular rhythm  Neck:  midline trachea, no thyromegaly or nodules  Breasts - breasts appear normal, no suspicious masses, no skin or nipple changes or  axillary nodes  Abdomen - soft, nontender, nondistended, no masses or organomegaly, no reproducible pain  Pelvic - VULVA: normal appearing vulva with no masses, tenderness or lesions  VAGINA: normal appearing vagina with normal color and discharge, no lesions  CERVIX: normal appearing cervix without discharge or lesions, no CMT  UTERUS: uterus is felt to be normal size, shape, consistency and nontender   ADNEXA: No adnexal masses or tenderness noted.  Extremities:  No swelling or varicosities noted  Chaperone:  pt declined      Assessment & Plan:  1) Well-Woman Exam -Pap up-to-date reviewed ASCCP guidelines  2) pelvic pain -No abnormalities noted on exam -Plan for pelvic ultrasound to rule out underlying etiology  3) BRCA (think BRCA2) -Discussed family-planning, encourage patient to consider pregnancy in the near future [facility] physicians contacted and reported no records -mammogram due Dec 2024 Patient to obtain records on her own  4) Anxiety -Long discussion with patient about conservative versus medical management -She would prefer to hold off on medicine at this time and plans to do some therapy and utilize other resources []  f/u in 3  mos to see if there a change in her symptoms  No orders of the defined types were placed in this encounter.   Meds: No orders of the defined types were placed in this encounter.   Follow-up: No follow-ups on file.   Myna Hidalgo, DO Attending Obstetrician & Gynecologist, [facility] for [employer], [facility]

## 2023-03-26 ENCOUNTER — Other Ambulatory Visit: Payer: 59

## 2023-04-02 ENCOUNTER — Ambulatory Visit
Admission: RE | Admit: 2023-04-02 | Discharge: 2023-04-02 | Disposition: A | Discharge status: Home / Self Care | Payer: 59 | Source: Ambulatory Visit | Attending: Obstetrics & Gynecology | Admitting: Obstetrics & Gynecology

## 2023-04-02 DIAGNOSIS — R102 Pelvic and perineal pain: Secondary | ICD-10-CM

## 2023-04-30 ENCOUNTER — Encounter: Payer: Self-pay | Admitting: Obstetrics & Gynecology

## 2023-06-11 ENCOUNTER — Ambulatory Visit
Admission: RE | Admit: 2023-06-11 | Discharge: 2023-06-11 | Disposition: A | Discharge status: Home / Self Care | Payer: 59 | Source: Ambulatory Visit | Attending: Obstetrics & Gynecology

## 2023-06-11 DIAGNOSIS — Z1501 Genetic susceptibility to malignant neoplasm of breast: Secondary | ICD-10-CM

## 2023-06-11 HISTORY — DX: Genetic susceptibility to colorectal cancer: Z15.060

## 2023-06-11 HISTORY — DX: Genetic susceptibility to malignant neoplasm of breast: Z15.01

## 2023-10-23 ENCOUNTER — Ambulatory Visit: Admitting: Family Medicine

## 2023-10-23 ENCOUNTER — Encounter: Payer: Self-pay | Admitting: Family Medicine

## 2023-10-23 VITALS — BP 108/60 | HR 68 | Resp 18 | Ht 63.0 in | Wt 154.6 lb

## 2023-10-23 DIAGNOSIS — Z0000 Encounter for general adult medical examination without abnormal findings: Secondary | ICD-10-CM | POA: Diagnosis not present

## 2023-10-23 DIAGNOSIS — Z23 Encounter for immunization: Secondary | ICD-10-CM | POA: Diagnosis not present

## 2023-10-23 DIAGNOSIS — Z1329 Encounter for screening for other suspected endocrine disorder: Secondary | ICD-10-CM

## 2023-10-23 DIAGNOSIS — Z1321 Encounter for screening for nutritional disorder: Secondary | ICD-10-CM | POA: Diagnosis not present

## 2023-10-23 NOTE — Assessment & Plan Note (Signed)

## 2023-10-23 NOTE — Patient Instructions (Signed)
 It was great to meet you today and I'm excited to have you join the [facility] practice. I hope you had a positive experience today! If you feel so inclined, please feel free to recommend our practice to friends and family. Kurtis Bushman, FNP-C

## 2023-10-23 NOTE — Addendum Note (Signed)
 Addended by: Jenelle Mis on: 10/23/2023 10:59 AM   Modules accepted: Orders

## 2023-10-23 NOTE — Progress Notes (Signed)
 New Patient Office Visit  Subjective    Patient ID: Rachael Neal, female    DOB: 11-28-1990  Age: 33 y.o. MRN: [id number]  CC:  Chief Complaint  Patient presents with   Establish Care    New pt appt     HPI Rachael Neal presents to establish care. Oriented to practice routines and expectations. Has not seen PCP since 2022, does see OBGYN and dermatology. PMH includes BRCA1 gene positive. Other concerns include requests for labs checking vitamin levels.  Breast CA screening: Mammogram status: Completed 06/11/2023 due to BRCA positive. Repeat every year Cervical CA screening: approximate date 07/2020 and was normal Tobacco: non-smoker STI: declines Vaccines:  Tdap    No outpatient encounter medications on file as of 10/23/2023.   No facility-administered encounter medications on file as of 10/23/2023.    Past Medical History:  Diagnosis Date   BRCA1 gene mutation positive     Past Surgical History:  Procedure Laterality Date   CESAREAN SECTION N/A 09/17/2021   Procedure: CESAREAN SECTION;  Surgeon: Renea Carrion, MD;  Location: [facility];  Service: Obstetrics;  Laterality: N/A;    Family History  Problem Relation Age of Onset   BRCA 1/2 Mother     Social History   Socioeconomic History   Marital status: Married    Spouse name: Not on file   Number of children: Not on file   Years of education: Not on file   Highest education level: Not on file  Occupational History   Not on file  Tobacco Use   Smoking status: Never   Smokeless tobacco: Never  Vaping Use   Vaping status: Never Used  Substance and Sexual Activity   Alcohol use: Yes   Drug use: Never   Sexual activity: Yes    Birth control/protection: None  Other Topics Concern   Not on file  Social History Narrative   ** Merged History Encounter **       Social Drivers of Health   Financial Resource Strain: Low Risk  (03/14/2023)   Overall Financial Resource Strain (CARDIA)    Difficulty of  Paying Living Expenses: Not hard at all  Food Insecurity: No Food Insecurity (03/14/2023)   Hunger Vital Sign    Worried About Running Out of Food in the Last Year: Never true    Ran Out of Food in the Last Year: Never true  Transportation Needs: No Transportation Needs (03/14/2023)   PRAPARE - Administrator, Civil Service (Medical): No    Lack of Transportation (Non-Medical): No  Physical Activity: Sufficiently Active (03/14/2023)   Exercise Vital Sign    Days of Exercise per Week: 3 days    Minutes of Exercise per Session: 60 min  Stress: No Stress Concern Present (03/14/2023)   [employer] of Occupational Health - Occupational Stress Questionnaire    Feeling of Stress : Only a little  Social Connections: Moderately Integrated (03/14/2023)   Social Connection and Isolation Panel [NHANES]    Frequency of Communication with Friends and Family: Once a week    Frequency of Social Gatherings with Friends and Family: Once a week    Attends Religious Services: More than 4 times per year    Active Member of [employer] or Organizations: Yes    Attends Banker Meetings: More than 4 times per year    Marital Status: Married  Catering manager Violence: Not At Risk (03/14/2023)   Humiliation, Afraid, Rape, and Kick questionnaire  Fear of Current or Ex-Partner: No    Emotionally Abused: No    Physically Abused: No    Sexually Abused: No    Review of Systems  Constitutional: Negative.   HENT: Negative.    Eyes: Negative.   Respiratory: Negative.    Cardiovascular: Negative.   Gastrointestinal: Negative.   Genitourinary: Negative.   Musculoskeletal: Negative.   Skin: Negative.   Neurological: Negative.   Endo/Heme/Allergies: Negative.   Psychiatric/Behavioral: Negative.    All other systems reviewed and are negative.       Objective    BP 108/60   Pulse 68   Resp 18   Ht 5\' 3"  (1.6 m)   Wt 154 lb 9.6 oz (70.1 kg)   LMP 09/30/2023 (Exact Date)   SpO2  98%   BMI 27.39 kg/m   Physical Exam Vitals and nursing note reviewed.  Constitutional:      Appearance: Normal appearance. She is normal weight.  HENT:     Head: Normocephalic and atraumatic.     Right Ear: Tympanic membrane, ear canal and external ear normal.     Left Ear: Tympanic membrane, ear canal and external ear normal.     Nose: Nose normal.     Mouth/Throat:     Mouth: Mucous membranes are moist.     Pharynx: Oropharynx is clear.  Eyes:     Extraocular Movements: Extraocular movements intact.     Conjunctiva/sclera: Conjunctivae normal.     Pupils: Pupils are equal, round, and reactive to light.  Cardiovascular:     Rate and Rhythm: Normal rate and regular rhythm.     Pulses: Normal pulses.     Heart sounds: Normal heart sounds.  Pulmonary:     Effort: Pulmonary effort is normal.     Breath sounds: Normal breath sounds.  Abdominal:     General: Bowel sounds are normal.     Palpations: Abdomen is soft.  Musculoskeletal:        General: Normal range of motion.     Cervical back: Normal range of motion and neck supple.  Skin:    General: Skin is warm and dry.     Capillary Refill: Capillary refill takes less than 2 seconds.  Neurological:     General: No focal deficit present.     Mental Status: She is alert and oriented to person, place, and time. Mental status is at baseline.  Psychiatric:        Mood and Affect: Mood normal.        Behavior: Behavior normal.        Thought Content: Thought content normal.        Judgment: Judgment normal.         Assessment & Plan:   Problem List Items Addressed This Visit     Physical exam, annual - Primary   Today your medical history was reviewed and routine physical exam with labs was performed. Recommend 150 minutes of moderate intensity exercise weekly and consuming a well-balanced diet. Advised to stop smoking if a smoker, avoid smoking if a non-smoker, limit alcohol consumption to 1 drink per day for women and  2 drinks per day for men, and avoid illicit drug use. Counseled on safe sex practices and offered STI testing today. Counseled on the importance of sunscreen use. Counseled in mental health awareness and when to seek medical care. Vaccine maintenance discussed. Appropriate health maintenance items reviewed. Return to office in 1 year for annual physical exam.  Relevant Orders   CBC with Differential/Platelet   Lipid panel   VITAMIN D  25 Hydroxy (Vit-D Deficiency, Fractures)   Magnesium   TSH   COMPLETE METABOLIC PANEL WITHOUT GFR   Other Visit Diagnoses       Encounter for vitamin deficiency screening       Relevant Orders   CBC with Differential/Platelet   Lipid panel   VITAMIN D  25 Hydroxy (Vit-D Deficiency, Fractures)   Magnesium   TSH     Screening for thyroid disorder       Relevant Orders   TSH     Encounter for immunization       Relevant Orders   Tdap vaccine greater than or equal to 7yo IM (Completed)       Return in about 1 year (around 10/22/2024) for annual physical with labs 1 week prior.   Jenelle Mis, FNP

## 2023-10-23 NOTE — Addendum Note (Signed)
 Addended by: Jenelle Mis on: 10/23/2023 11:08 AM   Modules accepted: Orders

## 2023-10-24 ENCOUNTER — Encounter: Payer: Self-pay | Admitting: Family Medicine

## 2023-10-24 LAB — CBC WITH DIFFERENTIAL/PLATELET
Absolute Lymphocytes: 2688 {cells}/uL (ref 850–3900)
Absolute Monocytes: 605 {cells}/uL (ref 200–950)
Basophils Absolute: 36 {cells}/uL (ref 0–200)
Basophils Relative: 0.4 %
Eosinophils Absolute: 53 {cells}/uL (ref 15–500)
Eosinophils Relative: 0.6 %
HCT: 39.5 % (ref 35.0–45.0)
Hemoglobin: 13.4 g/dL (ref 11.7–15.5)
MCH: 31.7 pg (ref 27.0–33.0)
MCHC: 33.9 g/dL (ref 32.0–36.0)
MCV: 93.4 fL (ref 80.0–100.0)
MPV: 10.2 fL (ref 7.5–12.5)
Monocytes Relative: 6.8 %
Neutro Abs: 5518 {cells}/uL (ref 1500–7800)
Neutrophils Relative %: 62 %
Platelets: 328 10*3/uL (ref 140–400)
RBC: 4.23 10*6/uL (ref 3.80–5.10)
RDW: 11.9 % (ref 11.0–15.0)
Total Lymphocyte: 30.2 %
WBC: 8.9 10*3/uL (ref 3.8–10.8)

## 2023-10-24 LAB — COMPREHENSIVE METABOLIC PANEL WITH GFR
AG Ratio: 1.4 (calc) (ref 1.0–2.5)
AG Ratio: 1.7 (calc) (ref 1.0–2.5)
ALT: 21 U/L (ref 6–29)
ALT: 21 U/L (ref 6–29)
AST: 18 U/L (ref 10–30)
AST: 18 U/L (ref 10–30)
Albumin: 4.2 g/dL (ref 3.6–5.1)
Albumin: 4.3 g/dL (ref 3.6–5.1)
Alkaline phosphatase (APISO): 70 U/L (ref 31–125)
Alkaline phosphatase (APISO): 70 U/L (ref 31–125)
BUN: 21 mg/dL (ref 7–25)
BUN: 21 mg/dL (ref 7–25)
CO2: 25 mmol/L (ref 20–32)
CO2: 25 mmol/L (ref 20–32)
Calcium: 9.4 mg/dL (ref 8.6–10.2)
Calcium: 9.3 mg/dL (ref 8.6–10.2)
Chloride: 103 mmol/L (ref 98–110)
Chloride: 103 mmol/L (ref 98–110)
Creat: 0.67 mg/dL (ref 0.50–0.97)
Creat: 0.61 mg/dL (ref 0.50–0.97)
Globulin: 3 g/dL (ref 1.9–3.7)
Globulin: 2.6 g/dL (ref 1.9–3.7)
Glucose, Bld: 72 mg/dL (ref 65–99)
Glucose, Bld: 71 mg/dL (ref 65–99)
Potassium: 3.9 mmol/L (ref 3.5–5.3)
Potassium: 3.9 mmol/L (ref 3.5–5.3)
Sodium: 138 mmol/L (ref 135–146)
Sodium: 138 mmol/L (ref 135–146)
Total Bilirubin: 0.5 mg/dL (ref 0.2–1.2)
Total Bilirubin: 0.4 mg/dL (ref 0.2–1.2)
Total Protein: 7.2 g/dL (ref 6.1–8.1)
Total Protein: 6.9 g/dL (ref 6.1–8.1)
eGFR: 118 mL/min/{1.73_m2} (ref >=60)
eGFR: 121 mL/min/{1.73_m2} (ref >=60)

## 2023-10-24 LAB — VITAMIN D 25 HYDROXY (VIT D DEFICIENCY, FRACTURES): Vit D, 25-Hydroxy: 44 ng/mL (ref 30–100)

## 2023-10-24 LAB — TSH: TSH: 1.49 m[IU]/L

## 2023-10-24 LAB — MAGNESIUM: Magnesium: 2 mg/dL (ref 1.5–2.5)

## 2023-10-24 LAB — LIPID PANEL
Cholesterol: 194 mg/dL (ref <200)
HDL: 54 mg/dL (ref >=50)
LDL Cholesterol (Calc): 121 mg/dL — ABNORMAL HIGH
Non-HDL Cholesterol (Calc): 140 mg/dL — ABNORMAL HIGH (ref <130)
Total CHOL/HDL Ratio: 3.6 (calc) (ref <5.0)
Triglycerides: 93 mg/dL (ref <150)

## 2023-11-11 DIAGNOSIS — B001 Herpesviral vesicular dermatitis: Secondary | ICD-10-CM | POA: Insufficient documentation

## 2023-11-11 DIAGNOSIS — Z8639 Personal history of other endocrine, nutritional and metabolic disease: Secondary | ICD-10-CM | POA: Insufficient documentation

## 2023-11-11 DIAGNOSIS — N9412 Deep dyspareunia: Secondary | ICD-10-CM | POA: Insufficient documentation

## 2024-04-07 ENCOUNTER — Encounter: Payer: Self-pay | Admitting: Obstetrics & Gynecology

## 2024-04-07 ENCOUNTER — Ambulatory Visit (INDEPENDENT_AMBULATORY_CARE_PROVIDER_SITE_OTHER): Admitting: Obstetrics & Gynecology

## 2024-04-07 VITALS — BP 114/74 | HR 73 | Ht 62.0 in | Wt 157.0 lb

## 2024-04-07 DIAGNOSIS — Z1151 Encounter for screening for human papillomavirus (HPV): Secondary | ICD-10-CM | POA: Diagnosis not present

## 2024-04-07 DIAGNOSIS — Z1505 Genetic susceptibility to malignant neoplasm of fallopian tube(s): Secondary | ICD-10-CM

## 2024-04-07 DIAGNOSIS — Z1589 Genetic susceptibility to other disease: Secondary | ICD-10-CM | POA: Diagnosis not present

## 2024-04-07 DIAGNOSIS — Z1231 Encounter for screening mammogram for malignant neoplasm of breast: Secondary | ICD-10-CM

## 2024-04-07 DIAGNOSIS — Z1501 Genetic susceptibility to malignant neoplasm of breast: Secondary | ICD-10-CM

## 2024-04-07 DIAGNOSIS — Z1509 Genetic susceptibility to other malignant neoplasm: Secondary | ICD-10-CM

## 2024-04-07 DIAGNOSIS — Z15068 Genetic susceptibility to other malignant neoplasm of digestive system: Secondary | ICD-10-CM

## 2024-04-07 DIAGNOSIS — Z15060 Genetic susceptibility to colorectal cancer: Secondary | ICD-10-CM

## 2024-04-07 DIAGNOSIS — Z1331 Encounter for screening for depression: Secondary | ICD-10-CM | POA: Diagnosis not present

## 2024-04-07 DIAGNOSIS — Z1507 Genetic susceptibility to malignant neoplasm of urinary tract: Secondary | ICD-10-CM | POA: Diagnosis not present

## 2024-04-07 DIAGNOSIS — Z01419 Encounter for gynecological examination (general) (routine) without abnormal findings: Secondary | ICD-10-CM

## 2024-04-07 DIAGNOSIS — Z1502 Genetic susceptibility to malignant neoplasm of ovary: Secondary | ICD-10-CM | POA: Diagnosis not present

## 2024-04-07 NOTE — Progress Notes (Signed)
 WELL-WOMAN EXAMINATION Patient name: Tere Mcconaughey MRN [id number]  Date of birth: 1991/05/05 Chief Complaint:   Annual Exam  History of Present Illness:   Eleaner Dibartolo is a 33 y.o. G18P1001  female being seen today for a routine well-woman exam.   Menses are regular each month.  Denies intermenstrual bleeding or dysmenorrhea.  Denies issues with her menses  Denies vaginal discharge, itching, irritation.  Denies pelvic or abdominal pain.  Patient with known BRCA 1-followed by mammogram yearly as other testing is not covered by insurance  About to start trying for second pregnancy.  Patient taking PNV daily  Patient's last menstrual period was 04/07/2024 (exact date).  The current method of family planning is none.    Last pap 07/2020.  Last mammogram: Last completed 06/2023. Last colonoscopy: NA     04/07/2024    1:39 PM 10/23/2023    3:34 PM 10/23/2023   10:07 AM 03/14/2023   11:44 AM  Depression screen PHQ 2/9  Decreased Interest 0  0 0  Down, Depressed, Hopeless 0  0 0  PHQ - 2 Score 0  0 0  Altered sleeping 0 0  0  Tired, decreased energy 0 0  2  Change in appetite 0 0  1  Feeling bad or failure about yourself  0 0  0  Trouble concentrating 0 0  0  Moving slowly or fidgety/restless 0 0  0  Suicidal thoughts 0 0  0  PHQ-9 Score 0   3  Difficult doing work/chores  Not difficult at all        Review of Systems:   Pertinent items are noted in HPI Denies any headaches, blurred vision, fatigue, shortness of breath, chest pain, abdominal pain, bowel movements, urination, or intercourse unless otherwise stated above.  Pertinent History Reviewed:  Reviewed past medical,surgical, social and family history.  Reviewed problem list, medications and allergies. Physical Assessment:   Vitals:   04/07/24 1330  BP: 114/74  Pulse: 73  Weight: 157 lb (71.2 kg)  Height: 5' 2 (1.575 m)  Body mass index is 28.72 kg/m.        Physical Examination:   General appearance -  well appearing, and in no distress  Mental status - alert, oriented to person, place, and time  Psych:  She has a normal mood and affect  Skin - warm and dry, normal color, no suspicious lesions noted  Chest - effort normal, all lung fields clear to auscultation bilaterally  Heart - normal rate and regular rhythm  Neck:  midline trachea, no thyromegaly or nodules  Breasts - breasts appear normal, no suspicious masses, no skin or nipple changes or  axillary nodes  Abdomen - soft, nontender, nondistended, no masses or organomegaly  Pelvic - VULVA: normal appearing vulva with no masses, tenderness or lesions  VAGINA: normal appearing vagina with normal color and discharge, no lesions  CERVIX: normal appearing cervix without discharge or lesions, no CMT. Menses started today  Thin prep pap is done with HR HPV cotesting  UTERUS: uterus is felt to be normal size, shape, consistency and nontender   ADNEXA: No adnexal masses or tenderness noted.  Extremities:  No swelling or varicosities noted  Chaperone: Aleck Blase     Assessment & Plan:  1) Well-Woman Exam -Pap collected, reviewed ASCCP guidelines  2) family-planning - Continue PNV daily - Based on clinical history, suspect patient is ovulating - Follow-up once patient has positive pregnancy test  Orders Placed This Encounter  Procedures   MM 3D SCREENING MAMMOGRAM BILATERAL BREAST    Meds: No orders of the defined types were placed in this encounter.   Follow-up: Return in about 1 year (around 04/07/2025) for Annual.   Mariann Palo, DO Attending Obstetrician & Gynecologist, Faculty Practice Center for [facility], [facility]

## 2024-04-13 ENCOUNTER — Ambulatory Visit: Payer: Self-pay | Admitting: Obstetrics & Gynecology

## 2024-04-13 LAB — CYTOLOGY - PAP
Comment: NEGATIVE
Diagnosis: NEGATIVE
Diagnosis: REACTIVE
High risk HPV: NEGATIVE

## 2024-04-15 ENCOUNTER — Telehealth: Payer: Self-pay | Admitting: Obstetrics & Gynecology

## 2024-04-15 NOTE — Telephone Encounter (Signed)
 Patient states mammogram order was put in. [facility] called her to ask about notes on order about age range and said that they needed to make sure they were correct before scheduling.Please advise.

## 2024-05-07 ENCOUNTER — Ambulatory Visit

## 2024-05-07 DIAGNOSIS — Z3202 Encounter for pregnancy test, result negative: Secondary | ICD-10-CM

## 2024-05-07 DIAGNOSIS — O209 Hemorrhage in early pregnancy, unspecified: Secondary | ICD-10-CM

## 2024-05-07 LAB — POCT URINE PREGNANCY: Preg Test, Ur: NEGATIVE

## 2024-05-07 NOTE — Progress Notes (Signed)
   NURSE VISIT- PREGNANCY CONFIRMATION   SUBJECTIVE:  Rachael Neal is a 33 y.o. G7P1001 female at Unknown by certain LMP of Patient's last menstrual period was 04/06/2024 (exact date). Here for pregnancy confirmation.  Home pregnancy test: positive x 8  She reports bleeding as well as passing clots. Patients requests labs due to already lost the pregnancy.  OBJECTIVE:  LMP 04/06/2024 (Exact Date)   Appears well, in no apparent distress  Results for orders placed or performed in visit on 05/07/24 (from the past 24 hours)  POCT urine pregnancy   Collection Time: 05/07/24 12:03 PM  Result Value Ref Range   Preg Test, Ur Negative Negative    ASSESSMENT: Negative pregnancy test   Patient sent to lab for HCG level as well as Progesterone.  PLAN:   Follow up based off of results of labs  Aleck FORBES Blase  05/07/2024 12:03 PM

## 2024-05-08 ENCOUNTER — Ambulatory Visit: Payer: Self-pay | Admitting: Adult Health

## 2024-05-08 ENCOUNTER — Telehealth: Payer: Self-pay | Admitting: Adult Health

## 2024-05-08 DIAGNOSIS — O209 Hemorrhage in early pregnancy, unspecified: Secondary | ICD-10-CM

## 2024-05-08 LAB — PROGESTERONE: Progesterone: 0.5 ng/mL

## 2024-05-08 LAB — BETA HCG QUANT (REF LAB): hCG Quant: 17 m[IU]/mL

## 2024-05-08 NOTE — Telephone Encounter (Signed)
Patient calling about lab results. Please advise.

## 2024-05-08 NOTE — Telephone Encounter (Signed)
 Pt viewed lab results and message from JAG. Closing this encounter. JSY

## 2024-05-12 ENCOUNTER — Telehealth: Payer: Self-pay | Admitting: Adult Health

## 2024-05-12 LAB — BETA HCG QUANT (REF LAB): hCG Quant: 65 m[IU]/mL

## 2024-05-12 NOTE — Telephone Encounter (Signed)
 Left message I called

## 2024-05-14 ENCOUNTER — Other Ambulatory Visit: Payer: Self-pay | Admitting: Adult Health

## 2024-05-14 DIAGNOSIS — O3680X0 Pregnancy with inconclusive fetal viability, not applicable or unspecified: Secondary | ICD-10-CM

## 2024-05-14 LAB — BETA HCG QUANT (REF LAB): hCG Quant: 74 m[IU]/mL

## 2024-05-15 ENCOUNTER — Other Ambulatory Visit

## 2024-05-15 ENCOUNTER — Ambulatory Visit: Payer: Self-pay | Admitting: Adult Health

## 2024-05-15 DIAGNOSIS — O3680X0 Pregnancy with inconclusive fetal viability, not applicable or unspecified: Secondary | ICD-10-CM

## 2024-05-15 DIAGNOSIS — Z3A01 Less than 8 weeks gestation of pregnancy: Secondary | ICD-10-CM

## 2024-05-15 DIAGNOSIS — Z3686 Encounter for antenatal screening for cervical length: Secondary | ICD-10-CM | POA: Diagnosis not present

## 2024-05-15 NOTE — Progress Notes (Signed)
 US  TA/TV: homogeneous uterus,WNL,no IUP visualized,normal ovaries,avascular simple left adnexa cyst 1.2 x .8 x 1.1 cm,no free fluid,no pain during ultrasound   Chaperone Tammy

## 2024-05-18 ENCOUNTER — Telehealth: Payer: Self-pay | Admitting: Obstetrics & Gynecology

## 2024-05-18 ENCOUNTER — Telehealth: Payer: Self-pay | Admitting: Adult Health

## 2024-05-18 NOTE — Telephone Encounter (Signed)
 Patient is calling very upset that she is getting mixed answers and is very scared that she may have a ectopic? Pregnancys. She doesn't wont to called to Mrs Signa she is wanting message to Ozan asking for called back

## 2024-05-18 NOTE — Telephone Encounter (Signed)
 Pt to get [facility] in am instead of waiting til Wednesday

## 2024-05-19 NOTE — Telephone Encounter (Signed)
 Called patient back to address her concerns and questions Reassured pt that based on bleeding suspect miscarriage though we cannot rule out ectopic For now plan to follow HCG level and pending what it shows will tell us  next step Ideally next HCG will trend down, but we will wait and see Questions and concerns were addressed  Rachael Weilbacher, DO Attending Obstetrician & Gynecologist, Faculty Practice Center for [facility], [facility]

## 2024-05-20 ENCOUNTER — Telehealth: Payer: Self-pay | Admitting: Obstetrics & Gynecology

## 2024-05-20 ENCOUNTER — Inpatient Hospital Stay (HOSPITAL_COMMUNITY)
Admission: AD | Admit: 2024-05-20 | Discharge: 2024-05-21 | Disposition: A | Discharge status: Home / Self Care | Attending: Obstetrics and Gynecology | Admitting: Obstetrics and Gynecology

## 2024-05-20 ENCOUNTER — Other Ambulatory Visit

## 2024-05-20 ENCOUNTER — Ambulatory Visit: Payer: Self-pay | Admitting: Adult Health

## 2024-05-20 DIAGNOSIS — O3680X0 Pregnancy with inconclusive fetal viability, not applicable or unspecified: Secondary | ICD-10-CM | POA: Insufficient documentation

## 2024-05-20 DIAGNOSIS — O0281 Inappropriate change in quantitative human chorionic gonadotropin (hCG) in early pregnancy: Secondary | ICD-10-CM | POA: Insufficient documentation

## 2024-05-20 DIAGNOSIS — O209 Hemorrhage in early pregnancy, unspecified: Secondary | ICD-10-CM

## 2024-05-20 DIAGNOSIS — Z3201 Encounter for pregnancy test, result positive: Secondary | ICD-10-CM | POA: Diagnosis not present

## 2024-05-20 DIAGNOSIS — R7989 Other specified abnormal findings of blood chemistry: Secondary | ICD-10-CM

## 2024-05-20 LAB — COMPREHENSIVE METABOLIC PANEL WITH GFR
ALT: 15 U/L (ref 0–44)
AST: 22 U/L (ref 15–41)
Albumin: 4 g/dL (ref 3.5–5.0)
Alkaline Phosphatase: 59 U/L (ref 38–126)
Anion gap: 13 (ref 5–15)
BUN: 14 mg/dL (ref 6–20)
CO2: 23 mmol/L (ref 22–32)
Calcium: 9.2 mg/dL (ref 8.9–10.3)
Chloride: 101 mmol/L (ref 98–111)
Creatinine, Ser: 1.07 mg/dL — ABNORMAL HIGH (ref 0.44–1.00)
GFR, Estimated: >60 mL/min (ref >60)
Glucose, Bld: 132 mg/dL — ABNORMAL HIGH (ref 70–99)
Potassium: 3.4 mmol/L — ABNORMAL LOW (ref 3.5–5.1)
Sodium: 137 mmol/L (ref 135–145)
Total Bilirubin: 0.3 mg/dL (ref 0.0–1.2)
Total Protein: 7.2 g/dL (ref 6.5–8.1)

## 2024-05-20 LAB — CBC
HCT: 38.1 % (ref 36.0–46.0)
Hemoglobin: 13.5 g/dL (ref 12.0–15.0)
MCH: 32.4 pg (ref 26.0–34.0)
MCHC: 35.4 g/dL (ref 30.0–36.0)
MCV: 91.4 fL (ref 80.0–100.0)
Platelets: 285 K/uL (ref 150–400)
RBC: 4.17 MIL/uL (ref 3.87–5.11)
RDW: 11.7 % (ref 11.5–15.5)
WBC: 9.9 K/uL (ref 4.0–10.5)
nRBC: 0 % (ref 0.0–0.2)

## 2024-05-20 LAB — HCG, QUANTITATIVE, PREGNANCY: hCG, Beta Chain, Quant, S: 539 m[IU]/mL — ABNORMAL HIGH (ref <5)

## 2024-05-20 LAB — BETA HCG QUANT (REF LAB): hCG Quant: 301 m[IU]/mL

## 2024-05-20 MED ORDER — METHOTREXATE FOR ECTOPIC PREGNANCY
50.0000 mg/m2 | Freq: Once | INTRAMUSCULAR | Status: AC
  Administered 2024-05-20: 87.5 mg via INTRAMUSCULAR
  Filled 2024-05-20: qty 3.5

## 2024-05-20 NOTE — Telephone Encounter (Signed)
 Patient called stating that she has been messaging Signa back and forward, but patient wants to speak to you before she goes and gets the shot that Delon is telling her to get. Patient asked if we send message to you to call her

## 2024-05-20 NOTE — Discharge Instructions (Signed)
 The risks of methotrexate were reviewed including failure requiring repeat dosing or eventual surgery. You expressed understanding that methotrexate involves frequent return visits to monitor lab values and that you remain at risk of ectopic rupture until your pregnancy hormone level returns to normal. You have opted to proceed with methotrexate.  We discussed side effects of photosensitivity & GI upset.  You should avoid direct sunlight and abstain from alcohol, NSAIDs and sexual intercourse for two weeks. If you are taking any multivitamins with folic acid you should discontinue them.  ?You should follow up on day 4 (11/22) and day 7 (11/25) for repeat pregnancy hormone level checks. You will follow up on day 4 at the MAU and follow on day 7 at [facility].  ?Call or return to the MAU with any abdominal pain, vomiting, fainting, or fever.

## 2024-05-20 NOTE — MAU Note (Signed)
..  Rachael Neal is a 33 y.o. at Unknown here in MAU reporting: here in MAU from office for repeat blood work. Reports slight pain on left lower abdominal pain.  LMP: 04/03/2024  Pain score: 1/10 Vitals:   05/20/24 1921  BP: 119/76  Pulse: 72  Resp: 18  Temp: 98.4 F (36.9 C)  SpO2: 100%     FHT:n/a Lab orders placed from triage: already in

## 2024-05-20 NOTE — Telephone Encounter (Signed)
-   Called patient to review her concerns - Reviewed that we have several episodes where hCG has not doubled as expected - Asked regarding follow-up ultrasound.  Reviewed that typically hCG is below 2000 and we would not expect to see anything by ultrasound - While she has had some bleeding that would suggest a miscarriage, the inappropriate increase in hCG leads us  to be concerned about an ectopic - Reviewed recommendation for methotrexate.  Briefly discussed risk benefits - Patient agreeable to go to MAU for evaluation and treatment -Questions and concerns were addressed  Bernardino Dowell, DO Attending Obstetrician & Gynecologist, Faculty Practice Center for [facility], [facility]

## 2024-05-21 NOTE — MAU Provider Note (Signed)
 Chief Complaint:  Labs Only   HPI   None     Rachael Neal is a 33 y.o. G1P1001 at Unknown who presents to maternity admissions after trending hCGs in the outpatient setting. Initially presenting at the beginning of November for missed period, had a negative urine pregnancy test but beta hCG 17. Trend over the weeks below: 11/6: 17 11/10: 65 11/12: 74 11/18: 301 Also notes 2 weeks of vaginal bleeding that stopped a few days ago. Ultrasound completed 11/14 shows no IUP. Dr. Ozan discussed methotrexate for likely ectopic pregnancy, patient amenable. Sent to MAU for further evaluation. Denies any symptoms at this time.    Past Medical History:  Diagnosis Date   BRCA1 gene mutation positive    OB History  Gravida Para Term Preterm AB Living  1 1 1  0 0 1  SAB IAB Ectopic Multiple Live Births  0 0 0  1    # Outcome Date GA Lbr Len/2nd Weight Sex Type Anes PTL Lv  1 Term 09/17/21 [redacted]w[redacted]d  3229 g M CS-[id number]  N LIV   Past Surgical History:  Procedure Laterality Date   CESAREAN SECTION N/A 09/17/2021   Procedure: CESAREAN SECTION;  Surgeon: Rachael Slough, MD;  Location: [facility] LD [facility];  Service: Obstetrics;  Laterality: N/A;   Family History  Problem Relation Age of Onset   Heart attack Father    BRCA 1/2 Mother    Social History   Tobacco Use   Smoking status: Never   Smokeless tobacco: Never  Vaping Use   Vaping status: Never Used  Substance Use Topics   Alcohol use: Not Currently   Drug use: Never   No Known Allergies No medications prior to admission.    I have reviewed patient's Past Medical Hx, Surgical Hx, Family Hx, Social Hx, medications and allergies.   ROS  Pertinent items noted in HPI and remainder of comprehensive ROS otherwise negative.   PHYSICAL EXAM  Patient Vitals for the past 24 hrs:  BP Temp Temp src Pulse Resp SpO2 Height Weight  05/20/24 1921 119/76 98.4 F (36.9 C) Oral 72 18 100 % 5' 2 (1.575 m) 70.8 kg    Constitutional: Well-developed,  well-nourished female in no acute distress.  Cardiovascular: Warm and well-perfused Respiratory: normal effort, no problems with respiration noted GI: Abd soft, non-tender, non-distended MS: Extremities nontender, no edema, normal ROM Neurologic: Alert and oriented x 4.  Pelvic: deferred       Labs: Results for orders placed or performed during the hospital encounter of 05/20/24 (from the past 24 hours)  hCG, quantitative, pregnancy     Status: Abnormal   Collection Time: 05/20/24  7:20 PM  Result Value Ref Range   hCG, Beta Chain, Quant, S 539 (H) <5 mIU/mL  Comprehensive metabolic panel     Status: Abnormal   Collection Time: 05/20/24  7:20 PM  Result Value Ref Range   Sodium 137 135 - 145 mmol/L   Potassium 3.4 (L) 3.5 - 5.1 mmol/L   Chloride 101 98 - 111 mmol/L   CO2 23 22 - 32 mmol/L   Glucose, Bld 132 (H) 70 - 99 mg/dL   BUN 14 6 - 20 mg/dL   Creatinine, Ser 8.92 (H) 0.44 - 1.00 mg/dL   Calcium 9.2 8.9 - 89.6 mg/dL   Total Protein 7.2 6.5 - 8.1 g/dL   Albumin 4.0 3.5 - 5.0 g/dL   AST 22 15 - 41 U/L   ALT 15 0 - 44 U/L  Alkaline Phosphatase 59 38 - 126 U/L   Total Bilirubin 0.3 0.0 - 1.2 mg/dL   GFR, Estimated >39 >39 mL/min   Anion gap 13 5 - 15  CBC     Status: None   Collection Time: 05/20/24  7:20 PM  Result Value Ref Range   WBC 9.9 4.0 - 10.5 K/uL   RBC 4.17 3.87 - 5.11 MIL/uL   Hemoglobin 13.5 12.0 - 15.0 g/dL   HCT 61.8 63.9 - 53.9 %   MCV 91.4 80.0 - 100.0 fL   MCH 32.4 26.0 - 34.0 pg   MCHC 35.4 30.0 - 36.0 g/dL   RDW 88.2 88.4 - 84.4 %   Platelets 285 150 - 400 K/uL   nRBC 0.0 0.0 - 0.2 %    Imaging:  No results found.  MDM & MAU COURSE  MDM: Moderate  MAU Course: Orders Placed This Encounter  Procedures   hCG, quantitative, pregnancy   Comprehensive metabolic panel   CBC   Discharge patient   Meds ordered this encounter  Medications   methotrexate (for ectopic pregnancy) 25 mg/mL chemo injection    Methotrexate for ectopic pregnancy  administration and follow up after administration must take place at [facility] or [facility]  Has the patient been instructed to travel to one of these locations or has a transfer been facilitated:   Yes   VSS. CMP unremarkable. Beta hCG elevated to 539.  Noted that this could be due to using different labs. Case discussed with Dr. Erik. Given that all other labs were completed by the same lab, discussed and recommended methotrexate for treatment of ectopic pregnancy. Patient amenable.   The risks of methotrexate were reviewed including failure requiring repeat dosing or eventual surgery. She understands that methotrexate involves frequent return visits to monitor lab values and that she remains at risk of ectopic rupture until her beta is less than assay. The patient opts to proceed with methotrexate. She has no history of hepatic or renal dysfunction, has  normal BUN/Cr/LFT's/platelets. She is felt to be reliable for follow-up. Side effects of photosensitivity & GI upset were discussed. She knows to avoid direct sunlight and abstain from alcohol, aspirin and aspirin-like products for two weeks. She was counseled to discontinue any MVI with folic acid. She understands to follow up on D4 (11/22) and D7 (11/25) for repeat BHCG and was given the instruction sheet. Strict ectopic precautions were reviewed, the patient knows to call with any abdominal pain, vomiting, fainting, or any concerns with her health.  ASSESSMENT   1. Elevated serum hCG     PLAN  Discharge home in stable condition with return precautions.  Plan for follow up in the MAU on 11/12 for repeat labs.    Allergies as of 05/21/2024   No Known Allergies      Medication List     STOP taking these medications    multivitamin-prenatal 27-0.8 MG Tabs tablet        Rachael Duggar, MD  Family Medicine - Obstetrics Fellow

## 2024-05-23 ENCOUNTER — Inpatient Hospital Stay (HOSPITAL_COMMUNITY)
Admission: AD | Admit: 2024-05-23 | Discharge: 2024-05-23 | Disposition: A | Discharge status: Home / Self Care | Attending: Obstetrics & Gynecology | Admitting: Obstetrics & Gynecology

## 2024-05-23 ENCOUNTER — Ambulatory Visit: Payer: Self-pay | Admitting: Nurse Practitioner

## 2024-05-23 ENCOUNTER — Other Ambulatory Visit: Payer: Self-pay

## 2024-05-23 DIAGNOSIS — O0281 Inappropriate change in quantitative human chorionic gonadotropin (hCG) in early pregnancy: Secondary | ICD-10-CM | POA: Diagnosis not present

## 2024-05-23 DIAGNOSIS — Z3200 Encounter for pregnancy test, result unknown: Secondary | ICD-10-CM | POA: Diagnosis present

## 2024-05-23 DIAGNOSIS — R7989 Other specified abnormal findings of blood chemistry: Secondary | ICD-10-CM

## 2024-05-23 LAB — HCG, QUANTITATIVE, PREGNANCY: hCG, Beta Chain, Quant, S: 1077 m[IU]/mL — ABNORMAL HIGH (ref <5)

## 2024-05-23 NOTE — MAU Provider Note (Signed)
   S/HPI Ms. Zailah Zagami is a 33 y.o. G1P1001 patient who presents to MAU today with complaint of presents today for stat beta hCG level day 4 status post methotrexate  given on 05/20/2024.  Patient reports no issues or complications after methotrexate .  She did not denies any active vaginal bleeding or abdominal pain.  And she reports she does have follow-up scheduled for day 7 methot[facility]ate  [facility].   The remainder of the ROS is negative unless otherwise noted in HPI above  O BP 109/67 (BP Location: Right Arm)   Pulse 78   Temp 98 F (36.7 C) (Oral)   Resp 18   LMP 04/06/2024 (Exact Date)   SpO2 100%  Physical Exam Vitals and nursing note reviewed.  Constitutional:      General: She is not in acute distress.    Appearance: Normal appearance. She is normal weight. She is not ill-appearing.  HENT:     Head: Normocephalic.     Nose: Nose normal.  Cardiovascular:     Rate and Rhythm: Normal rate.  Pulmonary:     Effort: Pulmonary effort is normal.  Abdominal:     General: There is no distension.     Palpations: Abdomen is soft.     Tenderness: There is no abdominal tenderness.  Musculoskeletal:        General: Normal range of motion.     Cervical back: Normal range of motion.  Skin:    General: Skin is warm.  Neurological:     Mental Status: She is alert and oriented to person, place, and time.  Psychiatric:        Mood and Affect: Mood normal.        Behavior: Behavior normal.     MDM  LOW  D4 HCG Quant s/p M TX on 05/20/24 Has appointment scheduled at [facility] (05/26/26) for D7 labs   TREND  11/6: 17 11/10: 65 11/12: 74 11/18: 301 11/19: 539 ( methotrexate  given) 11/22: 1,077 D4 s/p methotrexate     Lab Results  Component Value Date   HCGBETAQNT 1,077 (H) 05/23/2024   HCGBETAQNT 539 (H) 05/20/2024     I have reviewed the patient chart and performed the physical exam .A/P as described below.  Counseling and education  provided and patient agreeable  with plan as described below. Verbalized understanding.    ASSESSMENT Medical screening exam complete Elevated serum hCG   S/P methotrexate  05/20/24 for likely ectopic with no IUP     PLAN Future Appointments  Date Time Provider Department Center  05/26/2024 10:00 AM [facility]  06/12/2024 10:30 AM [facility] 3 [facility]  10/22/2024  9:30 AM Kayla Jeoffrey RAMAN, FNP [facility]    Discharge from MAU in stable condition  See AVS for full description of educational information and instructions provided to the patient at time of discharge   Warning signs for worsening condition that would warrant emergency follow-up discussed  Patient may return to MAU as needed   Littie Olam LABOR, NP 05/23/2024 8:53 AM   This chart was dictated using voice recognition software, Dragon. Despite the best efforts of this provider to proofread and correct errors, errors may still occur which can change documentation meaning.

## 2024-05-23 NOTE — MAU Note (Addendum)
 Rachael Neal is a 33 y.o. at Unknown here in MAU reporting: here for repeat bhcg level   11/6: 17 11/10: 65 11/12: 74 11/18: 301 11/19: 539  Methotrexate  given on 11/19. Patient reports no issues or complications after injection, reports very minimal nausea. Lower back pain from her kidneys yesterday. Denies any vaginal bleeding or abdominal pain.   LMP: 04/06/24 Pain score: no pain currently  Vitals:   05/23/24 0848  BP: 109/67  Pulse: 78  Resp: 18  Temp: 98 F (36.7 C)  SpO2: 100%     FHT:n/a Lab orders placed from triage:  blood work

## 2024-05-26 ENCOUNTER — Other Ambulatory Visit

## 2024-05-26 DIAGNOSIS — O00102 Left tubal pregnancy without intrauterine pregnancy: Secondary | ICD-10-CM

## 2024-05-26 DIAGNOSIS — Z9221 Personal history of antineoplastic chemotherapy: Secondary | ICD-10-CM

## 2024-05-26 NOTE — Addendum Note (Signed)
 Addended by: NEYSA CLARITA RAMAN on: 05/26/2024 09:13 AM   Modules accepted: Orders

## 2024-05-27 ENCOUNTER — Other Ambulatory Visit: Payer: Self-pay | Admitting: Obstetrics & Gynecology

## 2024-05-27 ENCOUNTER — Ambulatory Visit: Payer: Self-pay | Admitting: Obstetrics & Gynecology

## 2024-05-27 DIAGNOSIS — O3680X0 Pregnancy with inconclusive fetal viability, not applicable or unspecified: Secondary | ICD-10-CM

## 2024-05-27 LAB — COMPREHENSIVE METABOLIC PANEL WITH GFR
ALT: 12 IU/L (ref 0–32)
AST: 19 IU/L (ref 0–40)
Albumin: 4.6 g/dL (ref 3.9–4.9)
Alkaline Phosphatase: 70 IU/L (ref 41–116)
BUN/Creatinine Ratio: 20 (ref 9–23)
BUN: 14 mg/dL (ref 6–20)
Bilirubin Total: 0.5 mg/dL (ref 0.0–1.2)
CO2: 21 mmol/L (ref 20–29)
Calcium: 9.3 mg/dL (ref 8.7–10.2)
Chloride: 101 mmol/L (ref 96–106)
Creatinine, Ser: 0.71 mg/dL (ref 0.57–1.00)
Globulin, Total: 2.4 g/dL (ref 1.5–4.5)
Glucose: 85 mg/dL (ref 70–99)
Potassium: 3.9 mmol/L (ref 3.5–5.2)
Sodium: 139 mmol/L (ref 134–144)
Total Protein: 7 g/dL (ref 6.0–8.5)
eGFR: 114 mL/min/1.73 (ref >59)

## 2024-05-27 LAB — BETA HCG QUANT (REF LAB): hCG Quant: 857 m[IU]/mL

## 2024-05-27 NOTE — Progress Notes (Signed)
 Orders for weekly HCG following methotrexate  treatment  Red Mandt, DO Attending Obstetrician & Gynecologist, [facility] for [employer], [facility]

## 2024-06-01 ENCOUNTER — Other Ambulatory Visit: Payer: Self-pay | Admitting: *Deleted

## 2024-06-01 DIAGNOSIS — O3680X0 Pregnancy with inconclusive fetal viability, not applicable or unspecified: Secondary | ICD-10-CM

## 2024-06-03 ENCOUNTER — Ambulatory Visit: Payer: Self-pay | Admitting: Obstetrics & Gynecology

## 2024-06-03 LAB — BETA HCG QUANT (REF LAB): hCG Quant: 392 m[IU]/mL

## 2024-06-09 ENCOUNTER — Other Ambulatory Visit: Payer: Self-pay | Admitting: *Deleted

## 2024-06-09 DIAGNOSIS — O209 Hemorrhage in early pregnancy, unspecified: Secondary | ICD-10-CM

## 2024-06-10 ENCOUNTER — Ambulatory Visit: Payer: Self-pay | Admitting: Obstetrics & Gynecology

## 2024-06-10 LAB — BETA HCG QUANT (REF LAB): hCG Quant: 178 m[IU]/mL

## 2024-06-12 ENCOUNTER — Ambulatory Visit
Admission: RE | Admit: 2024-06-12 | Discharge: 2024-06-12 | Disposition: A | Source: Ambulatory Visit | Attending: Obstetrics & Gynecology

## 2024-06-12 DIAGNOSIS — Z1231 Encounter for screening mammogram for malignant neoplasm of breast: Secondary | ICD-10-CM

## 2024-06-12 DIAGNOSIS — Z15060 Genetic susceptibility to colorectal cancer: Secondary | ICD-10-CM

## 2024-06-18 ENCOUNTER — Other Ambulatory Visit

## 2024-06-18 DIAGNOSIS — O3680X0 Pregnancy with inconclusive fetal viability, not applicable or unspecified: Secondary | ICD-10-CM

## 2024-06-19 ENCOUNTER — Ambulatory Visit: Payer: Self-pay | Admitting: Obstetrics & Gynecology

## 2024-06-19 LAB — BETA HCG QUANT (REF LAB): hCG Quant: 113 m[IU]/mL

## 2024-06-23 ENCOUNTER — Other Ambulatory Visit

## 2024-06-30 ENCOUNTER — Other Ambulatory Visit

## 2024-07-01 ENCOUNTER — Ambulatory Visit: Payer: Self-pay | Admitting: Obstetrics & Gynecology

## 2024-07-01 LAB — BETA HCG QUANT (REF LAB): hCG Quant: <1 m[IU]/mL

## 2024-07-07 ENCOUNTER — Other Ambulatory Visit

## 2024-07-07 DIAGNOSIS — R7989 Other specified abnormal findings of blood chemistry: Secondary | ICD-10-CM

## 2024-07-07 DIAGNOSIS — O209 Hemorrhage in early pregnancy, unspecified: Secondary | ICD-10-CM

## 2024-07-14 ENCOUNTER — Other Ambulatory Visit

## 2024-07-21 ENCOUNTER — Other Ambulatory Visit

## 2024-07-28 ENCOUNTER — Other Ambulatory Visit

## 2024-10-22 ENCOUNTER — Encounter: Admitting: Family Medicine
